# Patient Record
Sex: Female | Born: 1960 | Race: Black or African American | Hispanic: No | Marital: Single | Smoking: Former smoker
Health system: Southern US, Community
[De-identification: ages and names within clinical notes are randomized; demographics above are authoritative.]

## PROBLEM LIST (undated history)

## (undated) DIAGNOSIS — J45909 Unspecified asthma, uncomplicated: Secondary | ICD-10-CM

## (undated) HISTORY — PX: HAND SURGERY: SHX662

## (undated) HISTORY — PX: EXTERNAL EAR SURGERY: SHX627

## (undated) HISTORY — PX: FOOT SURGERY: SHX648

---

## 1999-07-11 ENCOUNTER — Other Ambulatory Visit: Admission: RE | Admit: 1999-07-11 | Discharge: 1999-07-11 | Payer: Self-pay | Admitting: Obstetrics and Gynecology

## 2000-09-03 ENCOUNTER — Other Ambulatory Visit: Admission: RE | Admit: 2000-09-03 | Discharge: 2000-09-03 | Payer: Self-pay | Admitting: Obstetrics and Gynecology

## 2001-11-25 ENCOUNTER — Other Ambulatory Visit: Admission: RE | Admit: 2001-11-25 | Discharge: 2001-11-25 | Payer: Self-pay | Admitting: Obstetrics and Gynecology

## 2003-03-05 ENCOUNTER — Other Ambulatory Visit: Admission: RE | Admit: 2003-03-05 | Discharge: 2003-03-05 | Payer: Self-pay | Admitting: Obstetrics and Gynecology

## 2004-09-20 ENCOUNTER — Other Ambulatory Visit: Admission: RE | Admit: 2004-09-20 | Discharge: 2004-09-20 | Payer: Self-pay | Admitting: Obstetrics and Gynecology

## 2005-12-11 ENCOUNTER — Other Ambulatory Visit: Admission: RE | Admit: 2005-12-11 | Discharge: 2005-12-11 | Payer: Self-pay | Admitting: Obstetrics and Gynecology

## 2006-02-05 ENCOUNTER — Other Ambulatory Visit: Admission: RE | Admit: 2006-02-05 | Discharge: 2006-02-05 | Payer: Self-pay | Admitting: Obstetrics and Gynecology

## 2007-03-13 ENCOUNTER — Emergency Department (HOSPITAL_COMMUNITY): Admission: EM | Admit: 2007-03-13 | Discharge: 2007-03-13 | Payer: Self-pay | Admitting: *Deleted

## 2007-03-17 ENCOUNTER — Emergency Department (HOSPITAL_COMMUNITY): Admission: EM | Admit: 2007-03-17 | Discharge: 2007-03-17 | Payer: Self-pay | Admitting: Emergency Medicine

## 2008-02-21 ENCOUNTER — Encounter: Admission: RE | Admit: 2008-02-21 | Discharge: 2008-02-21 | Payer: Self-pay | Admitting: Obstetrics and Gynecology

## 2009-05-10 ENCOUNTER — Encounter: Admission: RE | Admit: 2009-05-10 | Discharge: 2009-05-10 | Payer: Self-pay | Admitting: Obstetrics and Gynecology

## 2010-05-12 ENCOUNTER — Encounter: Admission: RE | Admit: 2010-05-12 | Discharge: 2010-05-12 | Payer: Self-pay | Admitting: Obstetrics and Gynecology

## 2011-05-02 ENCOUNTER — Other Ambulatory Visit: Payer: Self-pay | Admitting: Obstetrics and Gynecology

## 2011-05-02 DIAGNOSIS — Z1231 Encounter for screening mammogram for malignant neoplasm of breast: Secondary | ICD-10-CM

## 2011-05-15 ENCOUNTER — Ambulatory Visit
Admission: RE | Admit: 2011-05-15 | Discharge: 2011-05-15 | Disposition: A | Discharge status: Home / Self Care | Payer: BC Managed Care – PPO | Source: Ambulatory Visit | Attending: Obstetrics and Gynecology | Admitting: Obstetrics and Gynecology

## 2011-05-15 DIAGNOSIS — Z1231 Encounter for screening mammogram for malignant neoplasm of breast: Secondary | ICD-10-CM

## 2012-03-28 ENCOUNTER — Other Ambulatory Visit: Payer: Self-pay | Admitting: Obstetrics and Gynecology

## 2012-03-28 DIAGNOSIS — Z1231 Encounter for screening mammogram for malignant neoplasm of breast: Secondary | ICD-10-CM

## 2012-05-15 ENCOUNTER — Ambulatory Visit: Payer: BC Managed Care – PPO

## 2012-06-05 ENCOUNTER — Ambulatory Visit: Payer: BC Managed Care – PPO

## 2012-06-07 ENCOUNTER — Ambulatory Visit
Admission: RE | Admit: 2012-06-07 | Discharge: 2012-06-07 | Disposition: A | Discharge status: Home / Self Care | Payer: BC Managed Care – PPO | Source: Ambulatory Visit | Attending: Obstetrics and Gynecology | Admitting: Obstetrics and Gynecology

## 2012-06-07 DIAGNOSIS — Z1231 Encounter for screening mammogram for malignant neoplasm of breast: Secondary | ICD-10-CM

## 2013-07-23 ENCOUNTER — Other Ambulatory Visit: Payer: Self-pay

## 2013-07-23 DIAGNOSIS — Z1231 Encounter for screening mammogram for malignant neoplasm of breast: Secondary | ICD-10-CM

## 2013-08-08 ENCOUNTER — Ambulatory Visit
Admission: RE | Admit: 2013-08-08 | Discharge: 2013-08-08 | Disposition: A | Discharge status: Home / Self Care | Payer: BC Managed Care – PPO | Source: Ambulatory Visit

## 2013-08-08 DIAGNOSIS — Z1231 Encounter for screening mammogram for malignant neoplasm of breast: Secondary | ICD-10-CM

## 2014-08-20 ENCOUNTER — Other Ambulatory Visit: Payer: Self-pay

## 2014-08-20 DIAGNOSIS — Z1239 Encounter for other screening for malignant neoplasm of breast: Secondary | ICD-10-CM

## 2014-09-14 ENCOUNTER — Ambulatory Visit: Payer: BC Managed Care – PPO

## 2014-09-16 ENCOUNTER — Ambulatory Visit
Admission: RE | Admit: 2014-09-16 | Discharge: 2014-09-16 | Disposition: A | Discharge status: Home / Self Care | Payer: BC Managed Care – PPO | Source: Ambulatory Visit

## 2014-09-16 ENCOUNTER — Other Ambulatory Visit: Payer: Self-pay

## 2014-09-16 DIAGNOSIS — Z1231 Encounter for screening mammogram for malignant neoplasm of breast: Secondary | ICD-10-CM

## 2015-09-28 ENCOUNTER — Other Ambulatory Visit: Payer: Self-pay

## 2015-09-28 DIAGNOSIS — Z1231 Encounter for screening mammogram for malignant neoplasm of breast: Secondary | ICD-10-CM

## 2015-10-15 ENCOUNTER — Ambulatory Visit: Payer: BC Managed Care – PPO

## 2015-10-25 ENCOUNTER — Ambulatory Visit: Payer: BC Managed Care – PPO

## 2015-11-19 ENCOUNTER — Ambulatory Visit: Payer: BC Managed Care – PPO

## 2016-01-13 ENCOUNTER — Ambulatory Visit: Payer: BC Managed Care – PPO

## 2016-01-14 ENCOUNTER — Ambulatory Visit
Admission: RE | Admit: 2016-01-14 | Discharge: 2016-01-14 | Disposition: A | Discharge status: Home / Self Care | Payer: BC Managed Care – PPO | Source: Ambulatory Visit

## 2016-01-14 DIAGNOSIS — Z1231 Encounter for screening mammogram for malignant neoplasm of breast: Secondary | ICD-10-CM

## 2016-06-27 ENCOUNTER — Other Ambulatory Visit: Payer: Self-pay | Admitting: Obstetrics and Gynecology

## 2016-06-27 ENCOUNTER — Other Ambulatory Visit (HOSPITAL_COMMUNITY)
Admission: RE | Admit: 2016-06-27 | Discharge: 2016-06-27 | Disposition: A | Discharge status: Home / Self Care | Payer: BC Managed Care – PPO | Source: Ambulatory Visit | Attending: Obstetrics and Gynecology | Admitting: Obstetrics and Gynecology

## 2016-06-27 DIAGNOSIS — Z1151 Encounter for screening for human papillomavirus (HPV): Secondary | ICD-10-CM | POA: Diagnosis not present

## 2016-06-27 DIAGNOSIS — Z113 Encounter for screening for infections with a predominantly sexual mode of transmission: Secondary | ICD-10-CM | POA: Diagnosis present

## 2016-06-27 DIAGNOSIS — Z01419 Encounter for gynecological examination (general) (routine) without abnormal findings: Secondary | ICD-10-CM | POA: Diagnosis present

## 2016-06-30 LAB — CYTOLOGY - PAP

## 2016-08-27 ENCOUNTER — Encounter (HOSPITAL_COMMUNITY): Payer: Self-pay | Admitting: Emergency Medicine

## 2016-08-27 ENCOUNTER — Emergency Department (HOSPITAL_COMMUNITY)
Admission: EM | Admit: 2016-08-27 | Discharge: 2016-08-27 | Disposition: A | Discharge status: Home / Self Care | Payer: Worker's Compensation | Attending: Emergency Medicine | Admitting: Emergency Medicine

## 2016-08-27 DIAGNOSIS — Y829 Unspecified medical devices associated with adverse incidents: Secondary | ICD-10-CM | POA: Diagnosis not present

## 2016-08-27 DIAGNOSIS — K5900 Constipation, unspecified: Secondary | ICD-10-CM | POA: Diagnosis present

## 2016-08-27 DIAGNOSIS — T887XXA Unspecified adverse effect of drug or medicament, initial encounter: Secondary | ICD-10-CM | POA: Insufficient documentation

## 2016-08-27 DIAGNOSIS — K5903 Drug induced constipation: Secondary | ICD-10-CM | POA: Diagnosis not present

## 2016-08-27 DIAGNOSIS — T402X5A Adverse effect of other opioids, initial encounter: Secondary | ICD-10-CM | POA: Insufficient documentation

## 2016-08-27 LAB — CBC WITH DIFFERENTIAL/PLATELET
BASOS ABS: 0 10*3/uL (ref 0.0–0.1)
BASOS PCT: 0 %
EOS ABS: 0.2 10*3/uL (ref 0.0–0.7)
EOS PCT: 2 %
HEMATOCRIT: 35.9 % — AB (ref 36.0–46.0)
Hemoglobin: 11.8 g/dL — ABNORMAL LOW (ref 12.0–15.0)
Lymphocytes Relative: 40 %
Lymphs Abs: 3.1 10*3/uL (ref 0.7–4.0)
MCH: 29.1 pg (ref 26.0–34.0)
MCHC: 32.9 g/dL (ref 30.0–36.0)
MCV: 88.4 fL (ref 78.0–100.0)
MONO ABS: 0.5 10*3/uL (ref 0.1–1.0)
MONOS PCT: 6 %
NEUTROS ABS: 3.9 10*3/uL (ref 1.7–7.7)
Neutrophils Relative %: 51 %
PLATELETS: 352 10*3/uL (ref 150–400)
RBC: 4.06 MIL/uL (ref 3.87–5.11)
RDW: 13.5 % (ref 11.5–15.5)
WBC: 7.6 10*3/uL (ref 4.0–10.5)

## 2016-08-27 LAB — COMPREHENSIVE METABOLIC PANEL
ALBUMIN: 3.8 g/dL (ref 3.5–5.0)
ALT: 21 U/L (ref 14–54)
ANION GAP: 8 (ref 5–15)
AST: 22 U/L (ref 15–41)
Alkaline Phosphatase: 63 U/L (ref 38–126)
BILIRUBIN TOTAL: 0.6 mg/dL (ref 0.3–1.2)
BUN: 13 mg/dL (ref 6–20)
CHLORIDE: 105 mmol/L (ref 101–111)
CO2: 26 mmol/L (ref 22–32)
Calcium: 9.6 mg/dL (ref 8.9–10.3)
Creatinine, Ser: 1.02 mg/dL — ABNORMAL HIGH (ref 0.44–1.00)
GFR calc Af Amer: >60 mL/min (ref >60)
GFR calc non Af Amer: >60 mL/min (ref >60)
GLUCOSE: 103 mg/dL — AB (ref 65–99)
POTASSIUM: 3.6 mmol/L (ref 3.5–5.1)
SODIUM: 139 mmol/L (ref 135–145)
TOTAL PROTEIN: 7.1 g/dL (ref 6.5–8.1)

## 2016-08-27 LAB — URINALYSIS, ROUTINE W REFLEX MICROSCOPIC
Bilirubin Urine: NEGATIVE
GLUCOSE, UA: NEGATIVE mg/dL
HGB URINE DIPSTICK: NEGATIVE
Ketones, ur: NEGATIVE mg/dL
LEUKOCYTES UA: NEGATIVE
Nitrite: NEGATIVE
PROTEIN: NEGATIVE mg/dL
SPECIFIC GRAVITY, URINE: 1.011 (ref 1.005–1.030)
pH: 7 (ref 5.0–8.0)

## 2016-08-27 LAB — POC URINE PREG, ED: Preg Test, Ur: NEGATIVE

## 2016-08-27 MED ORDER — POLYETHYLENE GLYCOL 3350 17 GM/SCOOP PO POWD: 17.0000 g | Freq: Two times a day (BID) | ORAL | 0 refills | Status: DC

## 2016-08-27 MED ORDER — MILK AND MOLASSES ENEMA
1.0000 | Freq: Once | RECTAL | Status: DC
  Filled 2016-08-27: qty 250

## 2016-08-27 NOTE — ED Provider Notes (Signed)
6:14 AM Patient signed out to me at change of shift by [facility] Mut[facility], PA-C.  Patient with opioid-induced constipation following orthopedic surgery.  Pending enema.  Anticipate d/c home following this.    7:23 AM Nurse reports patient has had bowel movement.  Plan for d/c home with prescriptions and instructions.     Trixie Dredgemily Dezi Schaner, PA-C 08/27/16 [id number]    Layla MawKristen N Ward, DO 08/27/16 [phone]

## 2016-08-27 NOTE — ED Provider Notes (Addendum)
[facility]-EMERGENCY DEPT Provider Note   CSN: [id number] Arrival date & time: 08/27/16  0011     History   Chief Complaint Chief Complaint  Patient presents with  . Constipation    HPI Dana Diaz is a 55 y.o. female with no major medical problems presents to the Emergency Department complaining of gradual, persistent, progressively worsening constipation onset Monday (5 days ago).  Pt reports last normal BM was 5 days ago.  She then had surgery on Tuesday and has been taking oxycodone around the clock for several days.  She reports 3 tabs of colace in the last 24 hours without relief.  Pt reports she has a large ball of stool in her rectum that will not come out.  She has tried a fleet enema today without relief.  Pt denies abd pain, N/V/D, weakness, fever, chills.      The history is provided by the patient and medical records. No language interpreter was used.    History reviewed. No pertinent past medical history.  There are no active problems to display for this patient.   Past Surgical History:  Procedure Laterality Date  . FOOT SURGERY      OB History    No data available       Home Medications    Prior to Admission medications   Medication Sig Start Date End Date Taking? Authorizing Provider  polyethylene glycol powder (GLYCOLAX/MIRALAX) powder Take 17 g by mouth 2 (two) times daily. 08/27/16   Whitnie Deleon, PA-C    Family History No family history on file.  Social History Social History  Substance Use Topics  . Smoking status: Never Smoker  . Smokeless tobacco: Never Used  . Alcohol use No     Allergies   Review of patient's allergies indicates no known allergies.   Review of Systems Review of Systems  Gastrointestinal: Positive for constipation.  All other systems reviewed and are negative.    Physical Exam Updated Vital Signs BP 124/84 (BP Location: Left Arm)   Pulse 99   Temp 98.1 F (36.7 C) (Oral)   Resp 18   LMP  07/27/2016   SpO2 100%   Physical Exam  Constitutional: She appears well-developed and well-nourished. No distress.  Awake, alert, nontoxic appearance  HENT:  Head: Normocephalic and atraumatic.  Mouth/Throat: Oropharynx is clear and moist. No oropharyngeal exudate.  Eyes: Conjunctivae are normal. No scleral icterus.  Neck: Normal range of motion. Neck supple.  Cardiovascular: Normal rate, regular rhythm and intact distal pulses.   Pulmonary/Chest: Effort normal and breath sounds normal. No respiratory distress. She has no wheezes.  Equal chest expansion  Abdominal: Soft. Bowel sounds are normal. She exhibits no mass. There is no tenderness. There is no rebound and no guarding.  Genitourinary:  Genitourinary Comments: Hard stool in the rectal vault. Unable to manually disimpact.  Musculoskeletal: Normal range of motion. She exhibits no edema.  Right foot with cast and post-op shoe in place  Neurological: She is alert.  Speech is clear and goal oriented Moves extremities without ataxia  Skin: Skin is warm and dry. She is not diaphoretic.  Psychiatric: She has a normal mood and affect.  Nursing note and vitals reviewed.    ED Treatments / Results  Labs (all labs ordered are listed, but only abnormal results are displayed) Labs Reviewed  CBC WITH DIFFERENTIAL/PLATELET - Abnormal; Notable for the following:       Result Value   Hemoglobin 11.8 (*)    HCT  35.9 (*)    All other components within normal limits  COMPREHENSIVE METABOLIC PANEL - Abnormal; Notable for the following:    Glucose, Bld 103 (*)    Creatinine, Ser 1.02 (*)    All other components within normal limits  URINALYSIS, ROUTINE W REFLEX MICROSCOPIC (NOT AT [facility])  POC URINE PREG, ED    Procedures Procedures (including critical care time)  Medications Ordered in ED Medications  milk and molasses enema (not administered)     Initial Impression / Assessment and Plan / ED Course  I have reviewed the triage  vital signs and the nursing notes.  Pertinent labs & imaging results that were available during my care of the patient were reviewed by me and considered in my medical decision making (see chart for details).  Clinical Course  Comment By Time  Hard stool in the rectal vault but unable to manually disimpact. Will give enema to attempt bowel movement. Dahlia ClientHannah Davaun Quintela, PA-C 10/15 [phone]  Enema pending.  At shift change care transferred to [facility]-C who will follow, reevaluate and dispo accordingly. Dierdre ForthHannah Montserrat Shek, PA-C 10/15 [phone]    Patient with opioid-induced constipation. Unable to manually disimpact. Will give out and molasses enema. Patient will be discharged home with Miralax.  Discussed other conservative therapies including increasing fluids, fiber and stool softeners.  Final Clinical Impressions(s) / ED Diagnoses   Final diagnoses:  Constipation due to opioid therapy    New Prescriptions New Prescriptions   POLYETHYLENE GLYCOL POWDER (GLYCOLAX/MIRALAX) POWDER    Take 17 g by mouth 2 (two) times daily.     Dahlia ClientHannah Oluwatobi Ruppe, PA-C 08/27/16 [phone]    Layla MawKristen N Ward, DO 08/27/16 0615    Dahlia ClientHannah Khylee Algeo, PA-C 08/27/16 [phone]    Layla MawKristen N Ward, DO 08/27/16 [phone]

## 2016-08-27 NOTE — ED Triage Notes (Signed)
Pt. reports constipation for several days unrelieved by laxative and enema , pt. had a recent right foot surgery this Tuesday and was prescribed with Oxycodone for pain control .

## 2016-08-27 NOTE — ED Notes (Signed)
Matt, RN, reports giving enema with good results around 0530.

## 2016-08-27 NOTE — Discharge Instructions (Addendum)
Read the information below.  Use the prescribed medication as directed.  Please discuss all new medications with your pharmacist.  You may return to the Emergency Department at any time for worsening condition or any new symptoms that concern you.    °

## 2017-01-01 ENCOUNTER — Other Ambulatory Visit: Payer: Self-pay | Admitting: Obstetrics and Gynecology

## 2017-01-01 DIAGNOSIS — Z1231 Encounter for screening mammogram for malignant neoplasm of breast: Secondary | ICD-10-CM

## 2017-01-19 ENCOUNTER — Ambulatory Visit: Payer: Self-pay

## 2017-01-26 ENCOUNTER — Ambulatory Visit
Admission: RE | Admit: 2017-01-26 | Discharge: 2017-01-26 | Disposition: A | Discharge status: Home / Self Care | Payer: BC Managed Care – PPO | Source: Ambulatory Visit | Attending: Obstetrics and Gynecology | Admitting: Obstetrics and Gynecology

## 2017-01-26 DIAGNOSIS — Z1231 Encounter for screening mammogram for malignant neoplasm of breast: Secondary | ICD-10-CM

## 2017-04-18 ENCOUNTER — Encounter (HOSPITAL_COMMUNITY): Payer: Self-pay | Admitting: Emergency Medicine

## 2017-04-18 ENCOUNTER — Emergency Department (HOSPITAL_COMMUNITY)
Admission: EM | Admit: 2017-04-18 | Discharge: 2017-04-18 | Disposition: A | Discharge status: Home / Self Care | Payer: BC Managed Care – PPO | Attending: Emergency Medicine | Admitting: Emergency Medicine

## 2017-04-18 ENCOUNTER — Emergency Department (HOSPITAL_COMMUNITY): Payer: BC Managed Care – PPO

## 2017-04-18 ENCOUNTER — Other Ambulatory Visit: Payer: Self-pay

## 2017-04-18 DIAGNOSIS — R0789 Other chest pain: Secondary | ICD-10-CM | POA: Diagnosis not present

## 2017-04-18 DIAGNOSIS — Z79899 Other long term (current) drug therapy: Secondary | ICD-10-CM | POA: Diagnosis not present

## 2017-04-18 DIAGNOSIS — J45909 Unspecified asthma, uncomplicated: Secondary | ICD-10-CM | POA: Diagnosis not present

## 2017-04-18 DIAGNOSIS — R1310 Dysphagia, unspecified: Secondary | ICD-10-CM | POA: Diagnosis not present

## 2017-04-18 HISTORY — DX: Unspecified asthma, uncomplicated: J45.909

## 2017-04-18 LAB — BASIC METABOLIC PANEL
Anion gap: 9 (ref 5–15)
BUN: 11 mg/dL (ref 6–20)
CALCIUM: 9.4 mg/dL (ref 8.9–10.3)
CO2: 24 mmol/L (ref 22–32)
Chloride: 106 mmol/L (ref 101–111)
Creatinine, Ser: 0.95 mg/dL (ref 0.44–1.00)
GFR calc Af Amer: >60 mL/min (ref >60)
GLUCOSE: 104 mg/dL — AB (ref 65–99)
Potassium: 3.4 mmol/L — ABNORMAL LOW (ref 3.5–5.1)
Sodium: 139 mmol/L (ref 135–145)

## 2017-04-18 LAB — CBC
HCT: 35.5 % — ABNORMAL LOW (ref 36.0–46.0)
Hemoglobin: 11.8 g/dL — ABNORMAL LOW (ref 12.0–15.0)
MCH: 29.1 pg (ref 26.0–34.0)
MCHC: 33.2 g/dL (ref 30.0–36.0)
MCV: 87.4 fL (ref 78.0–100.0)
Platelets: 359 10*3/uL (ref 150–400)
RBC: 4.06 MIL/uL (ref 3.87–5.11)
RDW: 13.9 % (ref 11.5–15.5)
WBC: 6.6 10*3/uL (ref 4.0–10.5)

## 2017-04-18 LAB — POCT I-STAT TROPONIN I
TROPONIN I, POC: 0 ng/mL (ref 0.00–0.08)
TROPONIN I, POC: 0 ng/mL (ref 0.00–0.08)

## 2017-04-18 MED ORDER — GI COCKTAIL ~~LOC~~
30.0000 mL | Freq: Once | ORAL | Status: AC
  Administered 2017-04-18: 30 mL via ORAL
  Filled 2017-04-18: qty 30

## 2017-04-18 MED ORDER — METHOCARBAMOL 500 MG PO TABS: 500.0000 mg | ORAL_TABLET | Freq: Two times a day (BID) | ORAL | 0 refills | Status: DC

## 2017-04-18 MED ORDER — PANTOPRAZOLE SODIUM 20 MG PO TBEC: 20.0000 mg | DELAYED_RELEASE_TABLET | Freq: Two times a day (BID) | ORAL | 0 refills | Status: DC

## 2017-04-18 MED ORDER — SUCRALFATE 1 GM/10ML PO SUSP: 1.0000 g | Freq: Three times a day (TID) | ORAL | 0 refills | Status: DC

## 2017-04-18 MED ORDER — FERROUS SULFATE 325 (65 FE) MG PO TABS: 325.0000 mg | ORAL_TABLET | Freq: Every day | ORAL | 0 refills | Status: DC

## 2017-04-18 NOTE — Discharge Instructions (Addendum)
Please follow up with gastroenterology. Call the number provided to set up an appointment.  Use the Carafate, as prescribed, for the next few days. Use the protonix twice a day. Robaxin as needed. Tylenol for pain.   Iron supplement daily due to minor anemia. Follow up with your primary care provider on this matter.

## 2017-04-18 NOTE — ED Notes (Signed)
Pt had a cup of water done well 

## 2017-04-18 NOTE — ED Provider Notes (Signed)
[facility]-EMERGENCY DEPT Provider Note   CSN: [id number] Arrival date & time: 04/18/17  1332     History   Chief Complaint Chief Complaint  Patient presents with  . Chest Pain    HPI Dana Diaz is a 56 y.o. female.  HPI   Dana Diaz is a 56 y.o. female, with a history of Asthma, presenting to the ED with chest discomfort for the last two days. Patient states that she was eating something and having an intensely unpleasant text conversation with a friend. She suddenly began to feel pain run from her chest to her throat. Initially was 10/10, now rates it 4/10 and describes it as a soreness. Worse with swallowing or burping. She ate soup yesterday, was able to swallow it, and keep it down, but states it caused her pain. States that something in her chest and throat now feel swollen.  Denies fever/chills, N/V, cough, shortness of breath, drooling, or any other complaints.      Past Medical History:  Diagnosis Date  . Asthma     There are no active problems to display for this patient.   Past Surgical History:  Procedure Laterality Date  . FOOT SURGERY      OB History    No data available       Home Medications    Prior to Admission medications   Medication Sig Start Date End Date Taking? Authorizing Provider  albuterol (PROVENTIL) (2.5 MG/3ML) 0.083% nebulizer solution Take 2.5 mg by nebulization every 6 (six) hours as needed for wheezing or shortness of breath.  03/31/17  Yes [provider]  gabapentin (NEURONTIN) 100 MG capsule take 1 capsule by mouth twice daily 04/13/17  Yes [provider]  ferrous sulfate 325 (65 FE) MG tablet Take 1 tablet (325 mg total) by mouth daily. 04/18/17   Joy, Shawn C, PA-C  fluticasone (FLONASE) 50 MCG/ACT nasal spray Place 1 spray into both nostrils daily as needed for allergies.  03/31/17   [provider]  JUNEL 1/20 1-20 MG-MCG tablet TAKE 1 TABLET BY MOUTH EVERY DAY FOR 3 WEEKS *1 WEEK OFF* 03/13/17    [provider]  methocarbamol (ROBAXIN) 500 MG tablet Take 1 tablet (500 mg total) by mouth 2 (two) times daily. 04/18/17   Joy, Shawn C, PA-C  pantoprazole (PROTONIX) 20 MG tablet Take 1 tablet (20 mg total) by mouth 2 (two) times daily before a meal. 04/18/17 04/23/17  Joy, Shawn C, PA-C  polyethylene glycol powder (GLYCOLAX/MIRALAX) powder Take 17 g by mouth 2 (two) times daily. Patient not taking: Reported on 04/18/2017 08/27/16   Muthersbaugh, Dahlia Client, PA-C  sucralfate (CARAFATE) 1 GM/10ML suspension Take 10 mLs (1 g total) by mouth 4 (four) times daily -  with meals and at bedtime. 04/18/17   Anselm Pancoast, PA-C    Family History History reviewed. No pertinent family history.  Social History Social History  Substance Use Topics  . Smoking status: Never Smoker  . Smokeless tobacco: Never Used  . Alcohol use No     Allergies   Patient has no known allergies.   Review of Systems Review of Systems  Constitutional: Negative for chills, diaphoresis and fever.  HENT: Negative for drooling and voice change.        Painful swallowing  Respiratory: Negative for cough and shortness of breath.   Gastrointestinal: Negative for abdominal pain, diarrhea, nausea and vomiting.       Pain with swallowing.  Musculoskeletal: Negative for back  pain.  Neurological: Negative for dizziness and light-headedness.  All other systems reviewed and are negative.    Physical Exam Updated Vital Signs BP 136/76 (BP Location: Left Arm)   Pulse 82   Temp 98 F (36.7 C) (Oral)   Resp 16   LMP 02/28/2017 (Approximate)   SpO2 100%   Physical Exam  Constitutional: She appears well-developed and well-nourished. No distress.  HENT:  Head: Normocephalic and atraumatic.  Mouth/Throat: Oropharynx is clear and moist.  Handles oral secretions without difficulty.  Eyes: Conjunctivae are normal.  Neck: Normal range of motion and phonation normal. Neck supple. No tracheal deviation and no edema present.  No thyromegaly present.  Swallow reflex appears to be intact. No noted swelling to the soft tissues of the neck. Both sides of the patient's throat rise equally with swallowing. When pressure is applied to the trachea, patient endorses pain behind it.   Cardiovascular: Normal rate, regular rhythm, normal heart sounds and intact distal pulses.   Pulmonary/Chest: Effort normal and breath sounds normal. No stridor. No respiratory distress. She exhibits no tenderness.  Abdominal: Soft. There is no tenderness. There is no guarding.  Musculoskeletal: She exhibits no edema.  Lymphadenopathy:    She has no cervical adenopathy.  Neurological: She is alert.  Skin: Skin is warm and dry. She is not diaphoretic. No pallor.  Psychiatric: She has a normal mood and affect. Her behavior is normal.  Nursing note and vitals reviewed.    ED Treatments / Results  Labs (all labs ordered are listed, but only abnormal results are displayed) Labs Reviewed  BASIC METABOLIC PANEL - Abnormal; Notable for the following:       Result Value   Potassium 3.4 (*)    Glucose, Bld 104 (*)    All other components within normal limits  CBC - Abnormal; Notable for the following:    Hemoglobin 11.8 (*)    HCT 35.5 (*)    All other components within normal limits  I-STAT TROPOININ, ED  POCT I-STAT TROPONIN I  I-STAT TROPOININ, ED  POCT I-STAT TROPONIN I    EKG  EKG Interpretation None       Radiology Dg Chest 2 View  Result Date: 04/18/2017 CLINICAL DATA:  Chest pain for 2 days EXAM: CHEST  2 VIEW COMPARISON:  Mar 17, 2007 FINDINGS: There is slight scarring in the medial left base. Lungs elsewhere are clear. Heart size and pulmonary vascularity are normal. No adenopathy. No pneumothorax. No bone lesions. IMPRESSION: Slight scarring medial left base. Lungs elsewhere clear. Cardiac silhouette within normal limits. Electronically Signed   By: Bretta Bang III M.D.   On: 04/18/2017 14:45     Procedures Procedures (including critical care time)  ED ECG REPORT   Date: 04/18/2017  Rate: 90  Rhythm: Sinus rhythm  QRS Axis: normal  Intervals: normal  ST/T Wave abnormalities: normal  Conduction Disutrbances:none  Narrative Interpretation:   Old EKG Reviewed: none available  I have personally reviewed the EKG tracing and agree with the computerized printout as noted.  Medications Ordered in ED Medications  gi cocktail (Maalox,Lidocaine,Donnatal) (30 mLs Oral Given 04/18/17 2035)     Initial Impression / Assessment and Plan / ED Course  I have reviewed the triage vital signs and the nursing notes.  Pertinent labs & imaging results that were available during my care of the patient were reviewed by me and considered in my medical decision making (see chart for details).     Patient presents with  painful swallowing. Handles oral secretions without difficulty. Patient is nontoxic appearing, afebrile, not tachycardic, not tachypneic, not hypotensive, and maintains SPO2 of 98-100% on room air. Patient voiced improvement after GI cocktail. Able to readily pass an oral fluid challenge. GI follow-up. The patient and her husband at the bedside were given instructions for home care as well as return precautions. Both parties voice understanding of these instructions, accept the plan, and are comfortable with discharge.    Vitals:   04/18/17 1355 04/18/17 1915 04/18/17 2159  BP: 125/82 136/76 129/87  Pulse: 89 82 83  Resp: 16 16 20   Temp: 98.2 F (36.8 C) 98 F (36.7 C)   TempSrc: Oral Oral   SpO2: 98% 100% 100%     Final Clinical Impressions(s) / ED Diagnoses   Final diagnoses:  Pain with swallowing    New Prescriptions Discharge Medication List as of 04/18/2017  9:51 PM    START taking these medications   Details  ferrous sulfate 325 (65 FE) MG tablet Take 1 tablet (325 mg total) by mouth daily., Starting Wed 04/18/2017, Print    methocarbamol (ROBAXIN) 500 MG  tablet Take 1 tablet (500 mg total) by mouth 2 (two) times daily., Starting Wed 04/18/2017, Print    pantoprazole (PROTONIX) 20 MG tablet Take 1 tablet (20 mg total) by mouth 2 (two) times daily before a meal., Starting Wed 04/18/2017, Until Mon 04/23/2017, Print    sucralfate (CARAFATE) 1 GM/10ML suspension Take 10 mLs (1 g total) by mouth 4 (four) times daily -  with meals and at bedtime., Starting Wed 04/18/2017, Print         Joy, Shawn C, PA-C 04/18/17 2254    Jacalyn LefevreHaviland, Julie, MD 04/18/17 [phone]

## 2017-04-18 NOTE — ED Triage Notes (Signed)
Pt c/o intermittent central chest pain x 2 days. Pt states it feels like something is stuck in her throat x 2 days, feels that anything po makes pain and pressure worse. Pt in no distress.

## 2017-12-19 ENCOUNTER — Other Ambulatory Visit: Payer: Self-pay | Admitting: Obstetrics and Gynecology

## 2017-12-19 DIAGNOSIS — Z139 Encounter for screening, unspecified: Secondary | ICD-10-CM

## 2018-01-28 ENCOUNTER — Ambulatory Visit
Admission: RE | Admit: 2018-01-28 | Discharge: 2018-01-28 | Disposition: A | Discharge status: Home / Self Care | Payer: BC Managed Care – PPO | Source: Ambulatory Visit | Attending: Obstetrics and Gynecology | Admitting: Obstetrics and Gynecology

## 2018-01-28 DIAGNOSIS — Z139 Encounter for screening, unspecified: Secondary | ICD-10-CM

## 2018-09-25 ENCOUNTER — Other Ambulatory Visit: Payer: Self-pay | Admitting: *Deleted

## 2018-09-25 DIAGNOSIS — N6320 Unspecified lump in the left breast, unspecified quadrant: Secondary | ICD-10-CM

## 2018-09-27 ENCOUNTER — Ambulatory Visit
Admission: RE | Admit: 2018-09-27 | Discharge: 2018-09-27 | Disposition: A | Discharge status: Home / Self Care | Payer: BC Managed Care – PPO | Source: Ambulatory Visit | Attending: *Deleted | Admitting: *Deleted

## 2018-09-27 DIAGNOSIS — N6320 Unspecified lump in the left breast, unspecified quadrant: Secondary | ICD-10-CM

## 2018-11-07 NOTE — Progress Notes (Deleted)
Cardiology Office Note:    Date:  11/07/2018   ID:  Dana Diaz, DOB 11/20/1960, MRN [id number]  PCP:  Marva PandaMillsaps, Kimberly, NP  Cardiologist:  Norman HerrlichBrian Munley, MD   Referring MD: Marva PandaMillsaps, Kimberly, NP  ASSESSMENT:    No diagnosis found. PLAN:    In order of problems listed above:  1. ***  Next appointment   Medication Adjustments/Labs and Tests Ordered: Current medicines are reviewed at length with the patient today.  Concerns regarding medicines are outlined above.  No orders of the defined types were placed in this encounter.  No orders of the defined types were placed in this encounter.    No chief complaint on file. ***  History of Present Illness:    Dana Diaz is a 57 y.o. female with a history of asthma who is being seen today for the evaluation of *** at the request of Marva PandaMillsaps, Kimberly, NP. [facility]18/[facility] with chest pain and pain on swallowing felt to be esophageal in etiology and was placed on a PPI.  Her EKG was normal and troponin was undetectable.  Her chest x-ray showed slight scarring left chest otherwise normal  Past Medical History:  Diagnosis Date  . Asthma     Past Surgical History:  Procedure Laterality Date  . FOOT SURGERY      Current Medications: No outpatient medications have been marked as taking for the 11/08/18 encounter (Appointment) with Baldo DaubMunley, Brian J, MD.     Allergies:   Patient has no known allergies.   Social History   Socioeconomic History  . Marital status: Single    Spouse name: Not on file  . Number of children: Not on file  . Years of education: Not on file  . Highest education level: Not on file  Occupational History  . Not on file  Social Needs  . Financial resource strain: Not on file  . Food insecurity:    Worry: Not on file    Inability: Not on file  . Transportation needs:    Medical: Not on file    Non-medical: Not on file  Tobacco Use  . Smoking status:  Never Smoker  . Smokeless tobacco: Never Used  Substance and Sexual Activity  . Alcohol use: No  . Drug use: No  . Sexual activity: Not on file  Lifestyle  . Physical activity:    Days per week: Not on file    Minutes per session: Not on file  . Stress: Not on file  Relationships  . Social connections:    Talks on phone: Not on file    Gets together: Not on file    Attends religious service: Not on file    Active member of club or organization: Not on file    Attends meetings of clubs or organizations: Not on file    Relationship status: Not on file  Other Topics Concern  . Not on file  Social History Narrative  . Not on file     Family History: The patient's ***family history includes Breast cancer in her maternal aunt.  ROS:   ROS Please see the history of present illness.    *** All other systems reviewed and are negative.  EKGs/Labs/Other Studies Reviewed:    The following studies were reviewed today: ***  EKG:  EKG is *** ordered today.  The ekg ordered today demonstrates ***  Recent Labs: No results found for requested labs within last 8760 hours.  Recent Lipid Panel No results found for: CHOL, TRIG, HDL, CHOLHDL, VLDL, LDLCALC, LDLDIRECT  Physical Exam:    VS:  There were no vitals taken for this visit.    Wt Readings from Last 3 Encounters:  No data found for Wt     GEN: *** Well nourished, well developed in no acute distress HEENT: Normal NECK: No JVD; No carotid bruits LYMPHATICS: No lymphadenopathy CARDIAC: ***RRR, no murmurs, rubs, gallops RESPIRATORY:  Clear to auscultation without rales, wheezing or rhonchi  ABDOMEN: Soft, non-tender, non-distended MUSCULOSKELETAL:  No edema; No deformity  SKIN: Warm and dry NEUROLOGIC:  Alert and oriented x 3 PSYCHIATRIC:  Normal affect     Signed, Norman HerrlichBrian Munley, MD  11/07/2018 9:52 AM    [facility]

## 2018-11-08 ENCOUNTER — Ambulatory Visit: Payer: Self-pay | Admitting: Cardiology

## 2018-11-14 NOTE — Progress Notes (Signed)
Cardiology Office Note:    Date:  11/15/2018   ID:  Dana Diaz, DOB May 08, 1961, MRN [id number]  PCP:  Marva Panda, NP  Cardiologist:  Norman Herrlich, MD   Referring MD: Marva Panda, NP  ASSESSMENT:    1. Palpitations   2. Irregular heartbeat    PLAN:    In order of problems listed above:  1. Symptoms are suggestive of arrhythmia will utilize a 2-week event monitor and if we document arrhythmia consider suppression if SVT she would benefit from a beta-blocker if having PAF she require an antiarrhythmic drug.  Next appointment 6 weeks   Medication Adjustments/Labs and Tests Ordered: Current medicines are reviewed at length with the patient today.  Concerns regarding medicines are outlined above.  Orders Placed This Encounter  Procedures  . LONG TERM MONITOR (3-14 DAYS)  . EKG 12-Lead   No orders of the defined types were placed in this encounter.    Chief Complaint  Patient presents with  . Palpitations    History of Present Illness:    Dana Diaz is a 58 y.o. female who is being seen today for the evaluation of palpitation at the request of Marva Panda, NP. For the last few months she has been having episodes where she gets brief rapid irregular heart rhythm and when it happens at nighttime she is momentarily short of breath.  She has episodes during the day where her heart quivers and also intermittently notices that she is dizzy or lightheaded but has not had syncope.  She has no document arrhythmia and shows me blood pressure heart rate recordings and on one occasion there was alarm for irregular heart rhythm and rates were in the range of 905 bpm with her symptoms.  She has no history of congenital rheumatic heart disease takes no over-the-counter proarrhythmic medications routine labs have been normal and thyroid was normal assessed in April 2008.  She is concerned about the potential of atrial fibrillation.  Past Medical History:  Diagnosis  Date  . Asthma     Past Surgical History:  Procedure Laterality Date  . EXTERNAL EAR SURGERY    . FOOT SURGERY    . HAND SURGERY      Current Medications: Current Meds  Medication Sig  . albuterol (PROVENTIL) (2.5 MG/3ML) 0.083% nebulizer solution Take 2.5 mg by nebulization every 6 (six) hours as needed for wheezing or shortness of breath.   Marland Kitchen azelastine (ASTELIN) 0.1 % nasal spray Place 1 spray into both nostrils daily. Use in each nostril as directed  . budesonide-formoterol (SYMBICORT) 160-4.5 MCG/ACT inhaler Inhale 2 puffs into the lungs 2 (two) times daily as needed.  Marland Kitchen dexlansoprazole (DEXILANT) 60 MG capsule Take 60 mg by mouth daily as needed.  . ferrous sulfate 325 (65 FE) MG tablet Take 1 tablet (325 mg total) by mouth daily.  . Olopatadine HCl 0.2 % SOLN Apply 1 drop to eye daily.  . sucralfate (CARAFATE) 1 GM/10ML suspension Take 10 mLs (1 g total) by mouth 4 (four) times daily -  with meals and at bedtime.     Allergies:   Patient has no known allergies.   Social History   Socioeconomic History  . Marital status: Single    Spouse name: Not on file  . Number of children: Not on file  . Years of education: Not on file  . Highest education level: Not on file  Occupational History  . Not on file  Social Needs  . Physicist, medical  strain: Not on file  . Food insecurity:    Worry: Not on file    Inability: Not on file  . Transportation needs:    Medical: Not on file    Non-medical: Not on file  Tobacco Use  . Smoking status: Former Smoker    Packs/day: 1.00    Years: 1.50    Pack years: 1.50  . Smokeless tobacco: Never Used  Substance and Sexual Activity  . Alcohol use: Yes    Comment: occasionally  . Drug use: No  . Sexual activity: Not on file  Lifestyle  . Physical activity:    Days per week: Not on file    Minutes per session: Not on file  . Stress: Not on file  Relationships  . Social connections:    Talks on phone: Not on file    Gets  together: Not on file    Attends religious service: Not on file    Active member of club or organization: Not on file    Attends meetings of clubs or organizations: Not on file    Relationship status: Not on file  Other Topics Concern  . Not on file  Social History Narrative  . Not on file     Family History: The patient's family history includes Breast cancer in her maternal aunt; CAD in her father; Diabetes in her brother and maternal grandmother; Irregular heart beat in her father and mother; Prostate cancer in her father; Stroke in her maternal aunt and maternal uncle.  ROS:   Review of Systems  Constitution: Positive for malaise/fatigue.  HENT: Negative.   Eyes: Negative.   Cardiovascular: Positive for palpitations.  Respiratory: Positive for shortness of breath (with palpitation).   Endocrine: Negative.   Hematologic/Lymphatic: Negative.   Skin: Negative.   Musculoskeletal: Negative.   Gastrointestinal: Negative.   Genitourinary: Negative.   Neurological: Positive for dizziness.  Psychiatric/Behavioral: Negative.   Allergic/Immunologic: Negative.    Please see the history of present illness.     All other systems reviewed and are negative.  EKGs/Labs/Other Studies Reviewed:    The following studies were reviewed today:   EKG:  EKG is  ordered today.  The ekg ordered today demonstrates sinus rhythm and is normal  Recent Labs: No results found for requested labs within last 8760 hours.  Recent Lipid Panel No results found for: CHOL, TRIG, HDL, CHOLHDL, VLDL, LDLCALC, LDLDIRECT  Physical Exam:    VS:  BP 118/78 (BP Location: Left Arm, Patient Position: Sitting, Cuff Size: Large)   Pulse 87   Ht 5' 7.5" (1.715 m)   Wt 215 lb 12.8 oz (97.9 kg)   BMI 33.30 kg/m     Wt Readings from Last 3 Encounters:  11/15/18 215 lb 12.8 oz (97.9 kg)     GEN:  Well nourished, well developed in no acute distress HEENT: Normal NECK: No JVD; No carotid bruits LYMPHATICS: No  lymphadenopathy CARDIAC: RRR, no murmurs, rubs, gallops RESPIRATORY:  Clear to auscultation without rales, wheezing or rhonchi  ABDOMEN: Soft, non-tender, non-distended MUSCULOSKELETAL:  No edema; No deformity  SKIN: Warm and dry NEUROLOGIC:  Alert and oriented x 3 PSYCHIATRIC:  Normal affect     Signed, Norman HerrlichBrian Delrico Minehart, MD  11/15/2018 2:37 PM    [facility]

## 2018-11-15 ENCOUNTER — Ambulatory Visit: Payer: BC Managed Care – PPO | Admitting: Cardiology

## 2018-11-15 ENCOUNTER — Encounter: Payer: Self-pay | Admitting: Cardiology

## 2018-11-15 ENCOUNTER — Ambulatory Visit: Payer: Self-pay | Admitting: Cardiology

## 2018-11-15 VITALS — BP 118/78 | HR 87 | Ht 67.5 in | Wt 215.8 lb

## 2018-11-15 DIAGNOSIS — R002 Palpitations: Secondary | ICD-10-CM

## 2018-11-15 DIAGNOSIS — I499 Cardiac arrhythmia, unspecified: Secondary | ICD-10-CM

## 2018-11-15 NOTE — Patient Instructions (Addendum)
Medication Instructions:  Your physician recommends that you continue on your current medications as directed. Please refer to the Current Medication list given to you today.  If you need a refill on your cardiac medications before your next appointment, please call your pharmacy.   Lab work: None  If you have labs (blood work) drawn today and your tests are completely normal, you will receive your results only by: Marland Kitchen MyChart Message (if you have MyChart) OR . A paper copy in the mail If you have any lab test that is abnormal or we need to change your treatment, we will call you to review the results.  Testing/Procedures: You had an EKG today.  Your physician has recommended that you wear a Zio monitor. Zio monitors are medical devices that record the heart's electrical activity. Doctors most often use these monitors to diagnose arrhythmias. Arrhythmias are problems with the speed or rhythm of the heartbeat. The monitor is a small, portable device. You can wear one while you do your normal daily activities. This is usually used to diagnose what is causing palpitations/syncope (passing out). Wear for 14 days.  Follow-Up: At [facility], you and your health needs are our priority.  As part of our continuing mission to provide you with exceptional heart care, we have created designated Provider Care Teams.  These Care Teams include your primary Cardiologist (physician) and Advanced Practice Providers (APPs -  Physician Assistants and Nurse Practitioners) who all work together to provide you with the care you need, when you need it. You will need a follow up appointment in 4 weeks.    KardiaMobile [email or web address]        FDA-cleared, clinical grade mobile EKG monitor: Lourena Simmonds is the most clinically-validated mobile EKG used by the world's leading cardiac care medical professionals With Basic service, know instantly if your heart rhythm is normal or if  atrial fibrillation is detected, and email the last single EKG recording to yourself or your doctor Premium service, available for purchase through the Kardia app for $9.99 per month or $99 per year, includes unlimited history and storage of your EKG recordings, a monthly EKG summary report to share with your doctor, along with the ability to track your blood pressure, activity and weight Includes one KardiaMobile phone clip FREE SHIPPING: Standard delivery 1-3 business days. Orders placed by 11:00am PST will ship that afternoon. Otherwise, will ship next business day. All orders ship via [employer] from [place], [place]  1. Avoid all over-the-counter antihistamines except Claritin/Loratadine and Zyrtec/Cetrizine. 2. Avoid all combination including cold sinus allergies flu decongestant and sleep medications 3. You can use Robitussin DM Mucinex and Mucinex DM for cough. 4. can use Tylenol aspirin ibuprofen and naproxen but no combinations such as sleep or sinus.

## 2018-11-26 ENCOUNTER — Ambulatory Visit (INDEPENDENT_AMBULATORY_CARE_PROVIDER_SITE_OTHER): Payer: BC Managed Care – PPO

## 2018-11-26 DIAGNOSIS — R002 Palpitations: Secondary | ICD-10-CM | POA: Diagnosis not present

## 2018-12-19 ENCOUNTER — Telehealth: Payer: Self-pay | Admitting: *Deleted

## 2018-12-19 DIAGNOSIS — I493 Ventricular premature depolarization: Secondary | ICD-10-CM

## 2018-12-19 DIAGNOSIS — I499 Cardiac arrhythmia, unspecified: Secondary | ICD-10-CM

## 2018-12-19 DIAGNOSIS — R002 Palpitations: Secondary | ICD-10-CM

## 2018-12-19 MED ORDER — ACEBUTOLOL HCL 200 MG PO CAPS: 200.0000 mg | ORAL_CAPSULE | Freq: Two times a day (BID) | ORAL | 2 refills | Status: DC

## 2018-12-19 NOTE — Telephone Encounter (Signed)
Patient informed of monitor results and is agreeable to having an echocardiogram done at the [facility] office. Patient is scheduled on Tuesday, 12/24/2018 at 4:00 pm. Address and phone number were provided. Patient will start acebutolol 200 mg twice daily today. Prescription has been sent to the [facility] in [place] as requested. Patient verbalized understanding. No further questions.

## 2018-12-19 NOTE — Telephone Encounter (Signed)
-----   Message from Baldo Daub, MD sent at 12/17/2018  6:04 PM EST ----- She had a brief run of PVCs. I would like her to have an echo and start a BB Acebutolol 200 mg BID and we can review in the office

## 2018-12-24 ENCOUNTER — Ambulatory Visit (HOSPITAL_COMMUNITY): Payer: BC Managed Care – PPO | Attending: Cardiology

## 2018-12-24 DIAGNOSIS — I493 Ventricular premature depolarization: Secondary | ICD-10-CM | POA: Diagnosis present

## 2018-12-24 DIAGNOSIS — R002 Palpitations: Secondary | ICD-10-CM | POA: Diagnosis present

## 2018-12-24 DIAGNOSIS — I499 Cardiac arrhythmia, unspecified: Secondary | ICD-10-CM | POA: Diagnosis present

## 2018-12-25 ENCOUNTER — Other Ambulatory Visit: Payer: Self-pay | Admitting: Obstetrics and Gynecology

## 2018-12-27 ENCOUNTER — Encounter: Payer: Self-pay | Admitting: Cardiology

## 2018-12-27 ENCOUNTER — Ambulatory Visit (INDEPENDENT_AMBULATORY_CARE_PROVIDER_SITE_OTHER): Payer: BC Managed Care – PPO | Admitting: Cardiology

## 2018-12-27 VITALS — BP 124/84 | HR 78 | Ht 67.5 in | Wt 216.5 lb

## 2018-12-27 DIAGNOSIS — J4520 Mild intermittent asthma, uncomplicated: Secondary | ICD-10-CM

## 2018-12-27 DIAGNOSIS — I493 Ventricular premature depolarization: Secondary | ICD-10-CM | POA: Diagnosis not present

## 2018-12-27 NOTE — Progress Notes (Signed)
Cardiology Office Note:    Date:  12/27/2018   ID:  Dana Diaz, DOB [birth year], MRN [id number]  PCP:  Marva Panda, NP  Cardiologist:  Norman Herrlich, MD    Referring MD: Marva Panda, NP    ASSESSMENT:    1. PVC's (premature ventricular contractions)   2. Mild intermittent asthma without complication    PLAN:    In order of problems listed above:  1. Although the frequency is low she had a 10 beat run of consecutive PVCs she has had symptoms of near syncope and previous syncope and no evidence of cardiomyopathy.  We placed her on a beta-blocker well-tolerated without exacerbating her asthma she has had a nice symptomatic response and will continue the same I asked her to avoid over-the-counter proarrhythmic drugs echocardiogram shows no evidence of cardiomyopathy and for further evaluation for catecholamine sensitive arrhythmia will undergo stress echocardiogram.  If she has exercise-induced arrhythmia or recurrent episode of syncope I will refer to my EP colleagues as individuals with structurally normal heart can have sustained ventricular arrhythmia. 2. Stable labs continue bronchodilators   Next appointment: 3 months   Medication Adjustments/Labs and Tests Ordered: Current medicines are reviewed at length with the patient today.  Concerns regarding medicines are outlined above.  Orders Placed This Encounter  Procedures  . ECHOCARDIOGRAM STRESS TEST   No orders of the defined types were placed in this encounter.   Chief Complaint  Patient presents with  . Follow-up    after echo and monitor    History of Present Illness:    Dana Diaz is a 58 y.o. female with a hx of palpitation and asthma last seen 11/15/2018.Marland Kitchen Compliance with diet, lifestyle and medications: Yes Her husband is with her today they are both interested in results of testing.  Her event monitor showed infrequent ventricular arrhythmia but if she had 110 beat run of PVCs.  Her husband  is present she has had syncope in the past and she has intermittent episodes of both palpitation and lightheadedness.  Her asthma is flared previously but is stable now and we placed her on a beta-blocker and she is very pleased with the results and has had no further episodes.  To evaluate for structural heart disease an echocardiogram was performed for all purposes normal with mild right ventricular enlargement but normal function and normal left ventricular function and without evidence of cardio myopathy.  She is reassured by these findings for further evaluation and wanted do a stress echo to look to see if there is catecholamine dependent ventricular arrhythmia and if that is present or she continues to have symptoms or syncope I will refer her to our electrophysiology colleagues. Past Medical History:  Diagnosis Date  . Asthma     Past Surgical History:  Procedure Laterality Date  . EXTERNAL EAR SURGERY    . FOOT SURGERY    . HAND SURGERY      Current Medications: Current Meds  Medication Sig  . acebutolol (SECTRAL) 200 MG capsule Take 1 capsule (200 mg total) by mouth 2 (two) times daily.  Marland Kitchen albuterol (PROVENTIL HFA;VENTOLIN HFA) 108 (90 Base) MCG/ACT inhaler Inhale into the lungs every 6 (six) hours as needed for wheezing or shortness of breath.  Marland Kitchen albuterol (PROVENTIL) (2.5 MG/3ML) 0.083% nebulizer solution Take 2.5 mg by nebulization every 6 (six) hours as needed for wheezing or shortness of breath.   Marland Kitchen azelastine (ASTELIN) 0.1 % nasal spray Place 1 spray into both nostrils daily.  Use in each nostril as directed  . budesonide-formoterol (SYMBICORT) 160-4.5 MCG/ACT inhaler Inhale 2 puffs into the lungs 2 (two) times daily as needed.  Marland Kitchen. dexlansoprazole (DEXILANT) 60 MG capsule Take 60 mg by mouth daily as needed.  . ferrous sulfate 325 (65 FE) MG tablet Take 1 tablet (325 mg total) by mouth daily.  . Olopatadine HCl 0.2 % SOLN Apply 1 drop to eye daily as needed.   . sucralfate  (CARAFATE) 1 GM/10ML suspension Take 10 mLs (1 g total) by mouth 4 (four) times daily -  with meals and at bedtime. (Patient taking differently: Take 1 g by mouth as directed. With meals and at bedtime as needed)     Allergies:   Patient has no known allergies.   Social History   Socioeconomic History  . Marital status: Single    Spouse name: Not on file  . Number of children: Not on file  . Years of education: Not on file  . Highest education level: Not on file  Occupational History  . Not on file  Social Needs  . Financial resource strain: Not on file  . Food insecurity:    Worry: Not on file    Inability: Not on file  . Transportation needs:    Medical: Not on file    Non-medical: Not on file  Tobacco Use  . Smoking status: Former Smoker    Packs/day: 1.00    Years: 1.50    Pack years: 1.50  . Smokeless tobacco: Never Used  Substance and Sexual Activity  . Alcohol use: Yes    Comment: occasionally  . Drug use: No  . Sexual activity: Not on file  Lifestyle  . Physical activity:    Days per week: Not on file    Minutes per session: Not on file  . Stress: Not on file  Relationships  . Social connections:    Talks on phone: Not on file    Gets together: Not on file    Attends religious service: Not on file    Active member of club or organization: Not on file    Attends meetings of clubs or organizations: Not on file    Relationship status: Not on file  Other Topics Concern  . Not on file  Social History Narrative  . Not on file     Family History: The patient's family history includes Breast cancer in her maternal aunt; CAD in her father; Diabetes in her brother and maternal grandmother; Irregular heart beat in her father and mother; Prostate cancer in her father; Stroke in her maternal aunt and maternal uncle. ROS:   Please see the history of present illness.    All other systems reviewed and are negative.  EKGs/Labs/Other Studies Reviewed:    The  following studies were reviewed today:    Recent Labs: No results found for requested labs within last 8760 hours.  Recent Lipid Panel No results found for: CHOL, TRIG, HDL, CHOLHDL, VLDL, LDLCALC, LDLDIRECT  Physical Exam:    VS:  BP 124/84 (BP Location: Right Arm, Patient Position: Sitting, Cuff Size: Normal)   Pulse 78   Ht 5' 7.5" (1.715 m)   Wt 216 lb 8 oz (98.2 kg)   SpO2 98%   BMI 33.41 kg/m     Wt Readings from Last 3 Encounters:  12/27/18 216 lb 8 oz (98.2 kg)  11/15/18 215 lb 12.8 oz (97.9 kg)     GEN:  Well nourished, well developed in no  acute distress HEENT: Normal NECK: No JVD; No carotid bruits LYMPHATICS: No lymphadenopathy CARDIAC: RRR, no murmurs, rubs, gallops RESPIRATORY:  Clear to auscultation without rales, wheezing or rhonchi  ABDOMEN: Soft, non-tender, non-distended MUSCULOSKELETAL:  No edema; No deformity  SKIN: Warm and dry NEUROLOGIC:  Alert and oriented x 3 PSYCHIATRIC:  Normal affect    Signed, Norman Herrlich, MD  12/27/2018 5:46 PM    [facility]

## 2018-12-27 NOTE — Patient Instructions (Addendum)
Medication Instructions:  Your physician recommends that you continue on your current medications as directed. Please refer to the Current Medication list given to you today.  If you need a refill on your cardiac medications before your next appointment, please call your pharmacy.   Lab work: NONE  If you have labs (blood work) drawn today and your tests are completely normal, you will receive your results only by: Marland Kitchen MyChart Message (if you have MyChart) OR . A paper copy in the mail If you have any lab test that is abnormal or we need to change your treatment, we will call you to review the results.  Testing/Procedures: Your physician has requested that you have a stress echocardiogram. For further information please visit [email or web address] Please follow instruction sheet as given.    Follow-Up: At [facility], you and your health needs are our priority.  As part of our continuing mission to provide you with exceptional heart care, we have created designated Provider Care Teams.  These Care Teams include your primary Cardiologist (physician) and Advanced Practice Providers (APPs -  Physician Assistants and Nurse Practitioners) who all work together to provide you with the care you need, when you need it. You will need a follow up appointment in 3 months.  Please call our office 2 months in advance to schedule this appointment.     Exercise Stress Echocardiogram  An exercise stress echocardiogram is a test to check how well your heart is working. This test uses sound waves (ultrasound) and a computer to make images of your heart before and after exercise. Ultrasound images that are taken before you exercise (your resting echocardiogram) will show how much blood is getting to your heart muscle and how well your heart muscle and heart valves are functioning. During the next part of this test, you will walk on a treadmill or ride a stationary bike to see how exercise affects your heart.  While you exercise, the electrical activity of your heart will be monitored with an electrocardiogram (ECG). Your blood pressure will also be monitored. You may have this test if you:  Have chest pain or other symptoms of a heart problem.  Recently had a heart attack or heart surgery.  Have heart valve problems.  Have a condition that causes narrowing of the blood vessels that supply your heart (coronary artery disease).  Have a high risk of heart disease and are starting a new exercise program.  Have a high risk of heart disease and need to have major surgery. Tell a health care provider about:  Any allergies you have.  All medicines you are taking, including vitamins, herbs, eye drops, creams, and over-the-counter medicines.  Any problems you or family members have had with anesthetic medicines.  Any blood disorders you have.  Any surgeries you have had.  Any medical conditions you have.  Whether you are pregnant or may be pregnant. What are the risks? Generally, this is a safe procedure. However, problems may occur, including:  Chest pain.  Dizziness or light-headedness.  Shortness of breath.  Increased or irregular heartbeat (palpitations).  Nausea or vomiting.  Heart attack (very rare). What happens before the procedure?  Follow instructions from your health care provider about eating or drinking restrictions. You may be asked to avoid all forms of caffeine for 24 hours before your procedure, or as told by your health care provider.  Ask your health care provider about changing or stopping your regular medicines. This is especially important if you  are taking diabetes medicines or blood thinners.  If you use an inhaler, bring it with you to the test.  Wear loose, comfortable clothing and walking shoes.  Do notuse any products that contain nicotine or tobacco, such as cigarettes and e-cigarettes, for 4 hours before the test or as told by your health care  provider. If you need help quitting, ask your health care provider. What happens during the procedure?  You will take off your clothes from the waist up and put on a hospital gown.  A technician will place electrodes on your chest.  A blood pressure cuff will be placed on your arm.  You will lie down on a table for an ultrasound exam before you exercise. Gel will be rubbed on your chest, and a handheld device (transducer) will be pressed against your chest and moved over your heart.  Then, you will start exercising by walking on a treadmill or pedaling a stationary bicycle.  Your blood pressure and heart rhythm will be monitored while you exercise.  The exercise will gradually get harder or faster.  You will exercise until: ? Your heart reaches a target level. ? You are too tired to continue. ? You cannot continue because of chest pain, weakness, or dizziness.  You will have another ultrasound exam after you stop exercising. The procedure may vary among health care providers and hospitals. What happens after the procedure?  Your heart rate and blood pressure will be monitored until they return to your normal levels. Summary  An exercise stress echocardiogram is a test that uses ultrasound to check how well your heart works before and after exercise.  Before the test, follow instructions from your health care provider about stopping medications, avoiding nicotine and tobacco, and avoiding certain foods and drinks.  During the test, your blood pressure and heart rhythm will be monitored while you exercise on a treadmill or stationary bicycle. This information is not intended to replace advice given to you by your health care provider. Make sure you discuss any questions you have with your health care provider. Document Released: 11/03/2004 Document Revised: 06/21/2016 Document Reviewed: 06/21/2016 Elsevier Interactive Patient Education  2019 [employer]vier Inc.       1. Avoid all  over-the-counter antihistamines except Claritin/Loratadine and Zyrtec/Cetrizine. 2. Avoid all combination including cold sinus allergies flu decongestant and sleep medications 3. You can use Robitussin DM Mucinex and Mucinex DM for cough. 4. can use Tylenol aspirin ibuprofen and naproxen but no combinations such as sleep or sinus.  KardiaMobile [email or web address]        FDA-cleared, clinical grade mobile EKG monitor: Lourena Simmonds is the most clinically-validated mobile EKG used by the world's leading cardiac care medical professionals With Basic service, know instantly if your heart rhythm is normal or if atrial fibrillation is detected, and email the last single EKG recording to yourself or your doctor Premium service, available for purchase through the Kardia app for $9.99 per month or $99 per year, includes unlimited history and storage of your EKG recordings, a monthly EKG summary report to share with your doctor, along with the ability to track your blood pressure, activity and weight Includes one KardiaMobile phone clip FREE SHIPPING: Standard delivery 1-3 business days. Orders placed by 11:00am PST will ship that afternoon. Otherwise, will ship next business day. All orders ship via [employer] from [place], [place]

## 2019-01-03 ENCOUNTER — Ambulatory Visit (HOSPITAL_BASED_OUTPATIENT_CLINIC_OR_DEPARTMENT_OTHER): Admission: RE | Admit: 2019-01-03 | Payer: BC Managed Care – PPO | Source: Ambulatory Visit

## 2019-01-28 ENCOUNTER — Other Ambulatory Visit: Payer: Self-pay | Admitting: *Deleted

## 2019-01-28 DIAGNOSIS — Z1231 Encounter for screening mammogram for malignant neoplasm of breast: Secondary | ICD-10-CM

## 2019-02-08 ENCOUNTER — Other Ambulatory Visit: Payer: Self-pay | Admitting: Cardiology

## 2019-03-03 ENCOUNTER — Ambulatory Visit: Payer: Self-pay

## 2019-04-03 NOTE — Progress Notes (Deleted)
{  Choose 1 Note Type (Telehealth Visit or Telephone Visit):[id number]}   Date:  04/03/2019   ID:  Dana Diaz, DOB Jul 05, 1961, MRN [id number]  {Patient Location:[id number]::"Home"} {Provider [phone]::"Home"}  PCP:  Marva Panda, NP  Cardiologist:  Norman Herrlich, MD *** Electrophysiologist:  None   Evaluation Performed:  {Choose Visit Type:[phone]::"Follow-Up Visit"}  Chief Complaint:  ***  History of Present Illness:    Dana Diaz is a 58 y.o. female with  palpitation and asthma last seen 12/27/18 with symptomatic PVC's.To evaluate for structural heart disease an echocardiogram was performed for all purposes normal with mild right ventricular enlargement but normal function and normal left ventricular function and without evidence of  cardio myopathy.     The patient {does/does not:200015} have symptoms concerning for COVID-19 infection (fever, chills, cough, or new shortness of breath).    Past Medical History:  Diagnosis Date  . Asthma    Past Surgical History:  Procedure Laterality Date  . EXTERNAL EAR SURGERY    . FOOT SURGERY    . HAND SURGERY       No outpatient medications have been marked as taking for the 04/04/19 encounter (Appointment) with Baldo Daub, MD.     Allergies:   Patient has no known allergies.   Social History   Tobacco Use  . Smoking status: Former Smoker    Packs/day: 1.00    Years: 1.50    Pack years: 1.50  . Smokeless tobacco: Never Used  Substance Use Topics  . Alcohol use: Yes    Comment: occasionally  . Drug use: No     Family Hx: The patient's family history includes Breast cancer in her maternal aunt; CAD in her father; Diabetes in her brother and maternal grandmother; Irregular heart beat in her father and mother; Prostate cancer in her father; Stroke in her maternal aunt and maternal uncle.  ROS:   Please see the history of present illness.    *** All other systems reviewed and are  negative.   Prior CV studies:   The following studies were reviewed today:  ***  Labs/Other Tests and Data Reviewed:    EKG:  {EKG/Telemetry Strips Reviewed:[phone]}  Recent Labs: No results found for requested labs within last 8760 hours.   Recent Lipid Panel No results found for: CHOL, TRIG, HDL, CHOLHDL, LDLCALC, LDLDIRECT  Wt Readings from Last 3 Encounters:  12/27/18 216 lb 8 oz (98.2 kg)  11/15/18 215 lb 12.8 oz (97.9 kg)     Objective:    Vital Signs:  There were no vitals taken for this visit.   {HeartCare Virtual Exam (Optional):[id number]::"VITAL SIGNS:  reviewed"}  ASSESSMENT & PLAN:    1. ***  COVID-19 Education: The signs and symptoms of COVID-19 were discussed with the patient and how to seek care for testing (follow up with PCP or arrange E-visit).  ***The importance of social distancing was discussed today.  Time:   Today, I have spent *** minutes with the patient with telehealth technology discussing the above problems.     Medication Adjustments/Labs and Tests Ordered: Current medicines are reviewed at length with the patient today.  Concerns regarding medicines are outlined above.   Tests Ordered: No orders of the defined types were placed in this encounter.   Medication Changes: No orders of the defined types were placed in this encounter.   Disposition:  Follow up {follow up:15908}  Signed, Norman Herrlich, MD  04/03/2019 3:26 PM    [facility]

## 2019-04-04 ENCOUNTER — Telehealth: Payer: BC Managed Care – PPO | Admitting: Cardiology

## 2019-04-04 ENCOUNTER — Other Ambulatory Visit: Payer: Self-pay

## 2019-04-15 ENCOUNTER — Other Ambulatory Visit: Payer: Self-pay

## 2019-04-15 ENCOUNTER — Ambulatory Visit
Admission: RE | Admit: 2019-04-15 | Discharge: 2019-04-15 | Disposition: A | Discharge status: Home / Self Care | Payer: BC Managed Care – PPO | Source: Ambulatory Visit | Attending: *Deleted | Admitting: *Deleted

## 2019-04-15 DIAGNOSIS — Z1231 Encounter for screening mammogram for malignant neoplasm of breast: Secondary | ICD-10-CM

## 2019-04-16 ENCOUNTER — Other Ambulatory Visit: Payer: Self-pay | Admitting: *Deleted

## 2019-04-16 DIAGNOSIS — R928 Other abnormal and inconclusive findings on diagnostic imaging of breast: Secondary | ICD-10-CM

## 2019-04-30 ENCOUNTER — Ambulatory Visit
Admission: RE | Admit: 2019-04-30 | Discharge: 2019-04-30 | Disposition: A | Discharge status: Home / Self Care | Payer: BC Managed Care – PPO | Source: Ambulatory Visit | Attending: *Deleted | Admitting: *Deleted

## 2019-04-30 ENCOUNTER — Other Ambulatory Visit: Payer: Self-pay

## 2019-04-30 DIAGNOSIS — R928 Other abnormal and inconclusive findings on diagnostic imaging of breast: Secondary | ICD-10-CM

## 2019-05-02 ENCOUNTER — Other Ambulatory Visit: Payer: Self-pay | Admitting: Cardiology

## 2019-05-24 ENCOUNTER — Other Ambulatory Visit: Payer: Self-pay | Admitting: Cardiology

## 2019-06-21 ENCOUNTER — Other Ambulatory Visit: Payer: Self-pay | Admitting: Cardiology

## 2019-07-29 ENCOUNTER — Other Ambulatory Visit: Payer: Self-pay | Admitting: Cardiology

## 2019-10-13 ENCOUNTER — Other Ambulatory Visit: Payer: Self-pay

## 2019-10-13 DIAGNOSIS — Z20822 Contact with and (suspected) exposure to covid-19: Secondary | ICD-10-CM

## 2019-10-14 LAB — NOVEL CORONAVIRUS, NAA: SARS-CoV-2, NAA: NOT DETECTED

## 2019-10-15 ENCOUNTER — Telehealth: Payer: Self-pay | Admitting: *Deleted

## 2019-10-15 NOTE — Telephone Encounter (Signed)
° °  Pt rec neg COVID results °

## 2019-10-23 ENCOUNTER — Other Ambulatory Visit: Payer: Self-pay

## 2019-10-23 DIAGNOSIS — Z20822 Contact with and (suspected) exposure to covid-19: Secondary | ICD-10-CM

## 2019-10-25 LAB — NOVEL CORONAVIRUS, NAA: SARS-CoV-2, NAA: NOT DETECTED

## 2020-02-05 ENCOUNTER — Other Ambulatory Visit: Payer: Self-pay | Admitting: *Deleted

## 2020-02-25 ENCOUNTER — Telehealth: Payer: Self-pay | Admitting: Cardiology

## 2020-02-25 NOTE — Telephone Encounter (Signed)
OK with me.

## 2020-02-25 NOTE — Telephone Encounter (Signed)
New Message:     Pt is wanting to change doctor and see a Development worker, international aid in our [facility]. She lives in [place] and it would be closer for her. Is this alright with you?

## 2020-02-25 NOTE — Telephone Encounter (Signed)
Yes it sounds like it make her health care easier

## 2020-02-25 NOTE — Telephone Encounter (Signed)
Message sent to scheduling to arrange  

## 2020-03-01 NOTE — Telephone Encounter (Signed)
OK with me.

## 2020-03-01 NOTE — Telephone Encounter (Signed)
Patient would like to see a provider in the [address] Dr. Dulce Sellar has agreed to the switch already are you in agreement?

## 2020-03-05 ENCOUNTER — Ambulatory Visit: Payer: BC Managed Care – PPO | Admitting: Cardiovascular Disease

## 2020-03-10 ENCOUNTER — Other Ambulatory Visit: Payer: Self-pay | Admitting: *Deleted

## 2020-03-10 DIAGNOSIS — Z1231 Encounter for screening mammogram for malignant neoplasm of breast: Secondary | ICD-10-CM

## 2020-04-15 ENCOUNTER — Ambulatory Visit: Payer: BC Managed Care – PPO

## 2020-04-16 ENCOUNTER — Ambulatory Visit: Payer: BC Managed Care – PPO

## 2020-04-19 ENCOUNTER — Other Ambulatory Visit: Payer: Self-pay

## 2020-04-19 ENCOUNTER — Ambulatory Visit
Admission: RE | Admit: 2020-04-19 | Discharge: 2020-04-19 | Disposition: A | Discharge status: Home / Self Care | Payer: BC Managed Care – PPO | Source: Ambulatory Visit | Attending: *Deleted | Admitting: *Deleted

## 2020-04-19 DIAGNOSIS — Z1231 Encounter for screening mammogram for malignant neoplasm of breast: Secondary | ICD-10-CM

## 2020-05-24 NOTE — Progress Notes (Signed)
Cardiology Office Note:    Date:  05/26/2020   ID:  Dana Diaz, DOB 02-Nov-1961, MRN [id number]  PCP:  Marva Panda, NP  Cardiologist:  Norman Herrlich, MD   Referring MD: Marva Panda, NP   Chief Complaint  Patient presents with  . Advice Only    Palpitations/weakness/dizziness    History of Present Illness:    Dana Diaz is a 59 y.o. female with a hx of asthma and recent palpitations for which she seeks advice at the request of Marva Panda, NP.  The patient has seen Dr. Dulce Sellar in the past.  She then saw Dr. Jacinto Halim.  Her concern has been palpitations, separate episodes of feeling dizzy as if blood is rushing to her head, and irregular heartbeats noted when she monitors her blood pressure even if she is not having palpitations.  The symptoms were evaluated by Dr. Dulce Sellar in 2019.  She had PVCs noted on a 7-day monitor with a 10 beat run of wide-complex tachycardia that was not recognized as a symptom.  An echocardiogram was also performed and did not reveal any structural abnormality.  Physical activity such as climbing stairs causes the heart to race, she occasionally feels dizzy, and frequently has to stop and rest.  She has to walk 3 flight of stairs to get to her apartment.  This is increasingly becoming a struggle.  Past Medical History:  Diagnosis Date  . Asthma     Past Surgical History:  Procedure Laterality Date  . EXTERNAL EAR SURGERY    . FOOT SURGERY    . HAND SURGERY      Current Medications: Current Meds  Medication Sig  . albuterol (PROVENTIL HFA;VENTOLIN HFA) 108 (90 Base) MCG/ACT inhaler Inhale into the lungs every 6 (six) hours as needed for wheezing or shortness of breath.  Marland Kitchen albuterol (PROVENTIL) (2.5 MG/3ML) 0.083% nebulizer solution Take 2.5 mg by nebulization every 6 (six) hours as needed for wheezing or shortness of breath.   . dexlansoprazole (DEXILANT) 60 MG capsule Take 60 mg by mouth daily as needed.  . fluticasone (FLONASE) 50  MCG/ACT nasal spray Place 2 sprays into both nostrils daily.  . Olopatadine HCl 0.2 % SOLN Apply 1 drop to eye daily as needed.      Allergies:   Patient has no known allergies.   Social History   Socioeconomic History  . Marital status: Single    Spouse name: Not on file  . Number of children: Not on file  . Years of education: Not on file  . Highest education level: Not on file  Occupational History  . Not on file  Tobacco Use  . Smoking status: Former Smoker    Packs/day: 1.00    Years: 1.50    Pack years: 1.50  . Smokeless tobacco: Never Used  Vaping Use  . Vaping Use: Never used  Substance and Sexual Activity  . Alcohol use: Yes    Comment: occasionally  . Drug use: No  . Sexual activity: Not on file  Other Topics Concern  . Not on file  Social History Narrative  . Not on file   Social Determinants of Health   Financial Resource Strain:   . Difficulty of Paying Living Expenses:   Food Insecurity:   . Worried About Programme researcher, broadcasting/film/video in the Last Year:   . Barista in the Last Year:   Transportation Needs:   . Freight forwarder (Medical):   Marland Kitchen Lack of  Transportation (Non-Medical):   Physical Activity:   . Days of Exercise per Week:   . Minutes of Exercise per Session:   Stress:   . Feeling of Stress :   Social Connections:   . Frequency of Communication with Friends and Family:   . Frequency of Social Gatherings with Friends and Family:   . Attends Religious Services:   . Active Member of Clubs or Organizations:   . Attends Banker Meetings:   Marland Kitchen Marital Status:      Family History: The patient's family history includes Breast cancer in her maternal aunt; CAD in her father; Diabetes in her brother and maternal grandmother; Irregular heart beat in her father and mother; Prostate cancer in her father; Stroke in her maternal aunt and maternal uncle.  ROS:   Please see the history of present illness.    Anxiety concerning her  health.  Dr. Jacinto Halim wanted to do a stress test but she cannot afford it.  All other systems reviewed and are negative.  EKGs/Labs/Other Studies Reviewed:    The following studies were reviewed today:  2D Doppler echocardiogram 2020 IMPRESSIONS    1. The left ventricle has normal systolic function of 60-65%. The cavity  size was normal. Left ventricular diastolic Doppler parameters are  consistent with impaired relaxation.  2. The right ventricle has normal systolic function. The cavity was  mildly enlarged. There is no increase in right ventricular wall thickness.  Right ventricular systolic pressure is normal.  3. The mitral valve is normal in structure.  4. The tricuspid valve is normal in structure.  5. The aortic valve is tricuspid.  6. The pulmonic valve was normal in structure.   Long-term monitor 2020: Study Highlights  A ZIO monitor was performed 13 days 7 hours initiating 11/26/2018 to assess palpitation.  The rhythm throughout is sinus with minimum average and maximum heart rates of 60, 92 and 162 bpm.  There is one triggered event sinus tachycardia rate of 136 bpm.  There were no pauses of 3 seconds or greater or episodes of AV node or sinus node block.  Although ventricular ectopy is rare with PVCs and couplet there is 110 beat run of PVCs right bundle branch morphology with fusion beats at the beginning and end in A-V dissociation.  Cycle length was 450 ms.  Supraventricular ectopy is rare without episodes of atrial fibrillation or flutter   Conclusion overall rare ventricular and supraventricular arrhythmia although there is one 10 beat run of PVCs asymptomatic.   EKG:  EKG normal sinus rhythm with left axis deviation and poor R wave progression.  Overall essentially normal.  Recent Labs: No results found for requested labs within last 8760 hours.  Recent Lipid Panel No results found for: CHOL, TRIG, HDL, CHOLHDL, VLDL, LDLCALC,  LDLDIRECT  Physical Exam:    VS:  BP 126/82   Pulse 88   Ht 5\' 7"  (1.702 m)   Wt 212 lb 12.8 oz (96.5 kg)   LMP 07/28/2018 (Approximate)   SpO2 95%   BMI 33.33 kg/m     Wt Readings from Last 3 Encounters:  05/26/20 212 lb 12.8 oz (96.5 kg)  12/27/18 216 lb 8 oz (98.2 kg)  11/15/18 215 lb 12.8 oz (97.9 kg)     GEN: Moderate obesity. No acute distress HEENT: Normal NECK: No JVD. LYMPHATICS: No lymphadenopathy CARDIAC:  RRR without murmur, gallop, or edema. VASCULAR:  Normal Pulses. No bruits. RESPIRATORY:  Clear to auscultation without rales, wheezing or  rhonchi  ABDOMEN: Soft, non-tender, non-distended, No pulsatile mass, MUSCULOSKELETAL: No deformity  SKIN: Warm and dry NEUROLOGIC:  Alert and oriented x 3 PSYCHIATRIC:  Normal affect   ASSESSMENT:    1. Palpitations   2. PVC's (premature ventricular contractions)   3. Other chest pain   4. Mild intermittent asthma without complication   5. Educated about COVID-19 virus infection   6. Other fatigue    PLAN:    In order of problems listed above:  1. PVCs were noted on monitoring.  Heart was structurally normal on echo done remotely.  There is a significant family of CAD on both the mother and father's side of the family.  Both her parents have had coronary bypass grafting.  Plan to perform an exercise treadmill test.  We will also request a coronary calcium score.  Exertional intolerance, dyspnea, may be related to ischemic heart disease.  There is no structural abnormality noted on echo.  PVCs could also be ischemia mediated. 2. Avoid caffeine.  Use Ventolin inhaler as infrequently as possible.  Talked about good sleep hygiene.  She does not drink alcohol.  Continue to observe. 3. Given family history of vascular disease, a coronary calcium score be obtained and if abnormal further testing in addition to an exercise treadmill test may be required. 4. Not discussed 5. She has been vaccinated   Medication  Adjustments/Labs and Tests Ordered: Current medicines are reviewed at length with the patient today.  Concerns regarding medicines are outlined above.  Orders Placed This Encounter  Procedures  . CT CARDIAC SCORING  . Exercise Tolerance Test  . EKG 12-Lead   No orders of the defined types were placed in this encounter.   Patient Instructions  Medication Instructions:  Your physician recommends that you continue on your current medications as directed. Please refer to the Current Medication list given to you today.  *If you need a refill on your cardiac medications before your next appointment, please call your pharmacy*   Lab Work: None If you have labs (blood work) drawn today and your tests are completely normal, you will receive your results only by: Marland Kitchen MyChart Message (if you have MyChart) OR . A paper copy in the mail If you have any lab test that is abnormal or we need to change your treatment, we will call you to review the results.   Testing/Procedures: Your physician has requested that you have an exercise tolerance test. For further information please visit [email or web address] Please also follow instruction sheet, as given.  Your physician recommends that you have a Calcium Score performed.   Follow-Up: At [facility], you and your health needs are our priority.  As part of our continuing mission to provide you with exceptional heart care, we have created designated Provider Care Teams.  These Care Teams include your primary Cardiologist (physician) and Advanced Practice Providers (APPs -  Physician Assistants and Nurse Practitioners) who all work together to provide you with the care you need, when you need it.  We recommend signing up for the patient portal called "MyChart".  Sign up information is provided on this After Visit Summary.  MyChart is used to connect with patients for Virtual Visits (Telemedicine).  Patients are able to view lab/test results, encounter  notes, upcoming appointments, etc.  Non-urgent messages can be sent to your provider as well.   To learn more about what you can do with MyChart, go to [email or web address].    Your next appointment:   As needed  The format for your next appointment:   In Person  Provider:   You may see Dr. Verdis PrimeHenry Kieara Schwark or one of the following Advanced Practice Providers on your designated Care Team:    Norma FredricksonLori Gerhardt, NP  Nada BoozerLaura Ingold, NP  Georgie ChardJill McDaniel, NP    Other Instructions      Signed, Lesleigh NoeHenry W Jezel Basto III, MD  05/26/2020 5:40 PM    [facility]

## 2020-05-26 ENCOUNTER — Ambulatory Visit (INDEPENDENT_AMBULATORY_CARE_PROVIDER_SITE_OTHER): Payer: BC Managed Care – PPO | Admitting: Interventional Cardiology

## 2020-05-26 ENCOUNTER — Other Ambulatory Visit: Payer: Self-pay

## 2020-05-26 ENCOUNTER — Encounter: Payer: Self-pay | Admitting: Interventional Cardiology

## 2020-05-26 VITALS — BP 126/82 | HR 88 | Ht 67.0 in | Wt 212.8 lb

## 2020-05-26 DIAGNOSIS — I493 Ventricular premature depolarization: Secondary | ICD-10-CM

## 2020-05-26 DIAGNOSIS — R0789 Other chest pain: Secondary | ICD-10-CM | POA: Diagnosis not present

## 2020-05-26 DIAGNOSIS — J4520 Mild intermittent asthma, uncomplicated: Secondary | ICD-10-CM

## 2020-05-26 DIAGNOSIS — R5383 Other fatigue: Secondary | ICD-10-CM

## 2020-05-26 DIAGNOSIS — R002 Palpitations: Secondary | ICD-10-CM

## 2020-05-26 DIAGNOSIS — Z7189 Other specified counseling: Secondary | ICD-10-CM

## 2020-05-26 NOTE — Patient Instructions (Signed)
Medication Instructions:  Your physician recommends that you continue on your current medications as directed. Please refer to the Current Medication list given to you today.  *If you need a refill on your cardiac medications before your next appointment, please call your pharmacy*   Lab Work: None If you have labs (blood work) drawn today and your tests are completely normal, you will receive your results only by: Marland Kitchen MyChart Message (if you have MyChart) OR . A paper copy in the mail If you have any lab test that is abnormal or we need to change your treatment, we will call you to review the results.   Testing/Procedures: Your physician has requested that you have an exercise tolerance test. For further information please visit [email or web address] Please also follow instruction sheet, as given.  Your physician recommends that you have a Calcium Score performed.   Follow-Up: At [facility], you and your health needs are our priority.  As part of our continuing mission to provide you with exceptional heart care, we have created designated Provider Care Teams.  These Care Teams include your primary Cardiologist (physician) and Advanced Practice Providers (APPs -  Physician Assistants and Nurse Practitioners) who all work together to provide you with the care you need, when you need it.  We recommend signing up for the patient portal called "MyChart".  Sign up information is provided on this After Visit Summary.  MyChart is used to connect with patients for Virtual Visits (Telemedicine).  Patients are able to view lab/test results, encounter notes, upcoming appointments, etc.  Non-urgent messages can be sent to your provider as well.   To learn more about what you can do with MyChart, go to [email or web address].    Your next appointment:   As needed  The format for your next appointment:   In Person  Provider:   You may see Dr. Verdis Prime or one of the following Advanced Practice  Providers on your designated Care Team:    Norma Fredrickson, NP  Nada Boozer, NP  Georgie Chard, NP    Other Instructions

## 2020-06-15 ENCOUNTER — Ambulatory Visit (INDEPENDENT_AMBULATORY_CARE_PROVIDER_SITE_OTHER)
Admission: RE | Admit: 2020-06-15 | Discharge: 2020-06-15 | Disposition: A | Discharge status: Home / Self Care | Payer: Self-pay | Source: Ambulatory Visit | Attending: Interventional Cardiology | Admitting: Interventional Cardiology

## 2020-06-15 ENCOUNTER — Ambulatory Visit (INDEPENDENT_AMBULATORY_CARE_PROVIDER_SITE_OTHER): Payer: BC Managed Care – PPO

## 2020-06-15 ENCOUNTER — Other Ambulatory Visit: Payer: Self-pay

## 2020-06-15 DIAGNOSIS — R002 Palpitations: Secondary | ICD-10-CM | POA: Diagnosis not present

## 2020-06-15 DIAGNOSIS — R0789 Other chest pain: Secondary | ICD-10-CM | POA: Diagnosis not present

## 2020-06-15 DIAGNOSIS — R5383 Other fatigue: Secondary | ICD-10-CM | POA: Diagnosis not present

## 2020-06-15 LAB — EXERCISE TOLERANCE TEST
Estimated workload: 8.6 METS
Exercise duration (min): 6 min
Exercise duration (sec): 32 s
MPHR: 163 {beats}/min
Peak HR: 151 {beats}/min
Percent HR: 92 %
RPE: 15
Rest HR: 86 {beats}/min

## 2020-06-18 ENCOUNTER — Telehealth: Payer: Self-pay

## 2020-06-18 ENCOUNTER — Telehealth: Payer: Self-pay | Admitting: Interventional Cardiology

## 2020-06-18 DIAGNOSIS — R0602 Shortness of breath: Secondary | ICD-10-CM

## 2020-06-18 NOTE — Telephone Encounter (Signed)
-----   Message from Henrietta Dine, RN sent at 06/16/2020  3:17 PM EDT ----- Per Dr. Katrinka Blazing, "There is no evidence of blockage from the coronary CT"  Left message to call back.

## 2020-06-18 NOTE — Telephone Encounter (Signed)
The patient has been notified of the result and verbalized understanding.  All questions (if any) were answered. Leanord Hawking, RN 06/18/2020 8:17 AM

## 2020-06-18 NOTE — Telephone Encounter (Signed)
I spoke with patient.  She reports she has trouble breathing when lying flat.  Had trouble lying flat for CT scan.  Happens most nights. History of asthma. Sleeps with 2 pillows propping her up. Has been going on for about a year.  Sometimes she will feel palpitations when lying flat but not always.  Sometimes when she gets up and checks her BP cuff it will show triple heart beats but not every time.  Also feels tired and short of breath when climbing stairs especially if she is carrying groceries.  Shortness of breath occurs also when it is hot or cold.  She discussed shortness of breath when climbing stairs with Dr Katrinka Blazing but does not think she talked with him about difficulty lying flat.  She is asking what could be causing this.

## 2020-06-18 NOTE — Telephone Encounter (Signed)
    Pt would like to ask Dr. Katrinka Blazing to why he cant breath when she lay flat

## 2020-06-20 NOTE — Telephone Encounter (Signed)
I don't know why she feels SOB when laying down. Needs BNP, PA and Lat CXR. Awaiting ETT.

## 2020-06-21 NOTE — Telephone Encounter (Signed)
Do you still want CXR and BNP?  ETT and Calcium Score were fine.

## 2020-06-22 NOTE — Telephone Encounter (Signed)
Yes

## 2020-06-23 NOTE — Telephone Encounter (Signed)
Left message to call back.  Orders placed for CXR and Pro BNP.

## 2020-06-24 NOTE — Telephone Encounter (Signed)
Spoke with pt and made her aware of recommendations.  Pt agreeable to plan.  She will come tomorrow for labs and then go to [facility] for CXR.

## 2020-06-25 ENCOUNTER — Other Ambulatory Visit: Payer: Self-pay

## 2020-06-25 ENCOUNTER — Other Ambulatory Visit: Payer: BC Managed Care – PPO | Admitting: *Deleted

## 2020-06-25 ENCOUNTER — Ambulatory Visit
Admission: RE | Admit: 2020-06-25 | Discharge: 2020-06-25 | Disposition: A | Discharge status: Home / Self Care | Payer: BC Managed Care – PPO | Source: Ambulatory Visit | Attending: Interventional Cardiology | Admitting: Interventional Cardiology

## 2020-06-25 DIAGNOSIS — R0602 Shortness of breath: Secondary | ICD-10-CM

## 2020-06-26 LAB — PRO B NATRIURETIC PEPTIDE: NT-Pro BNP: 67 pg/mL (ref 0–287)

## 2020-07-08 ENCOUNTER — Other Ambulatory Visit: Payer: Self-pay | Admitting: Obstetrics and Gynecology

## 2020-07-09 ENCOUNTER — Other Ambulatory Visit: Payer: Self-pay | Admitting: Obstetrics and Gynecology

## 2020-07-09 DIAGNOSIS — Z780 Asymptomatic menopausal state: Secondary | ICD-10-CM

## 2020-07-21 ENCOUNTER — Other Ambulatory Visit: Payer: Self-pay | Admitting: Obstetrics and Gynecology

## 2020-07-21 DIAGNOSIS — Z780 Asymptomatic menopausal state: Secondary | ICD-10-CM

## 2020-08-04 ENCOUNTER — Ambulatory Visit
Admission: RE | Admit: 2020-08-04 | Discharge: 2020-08-04 | Disposition: A | Discharge status: Home / Self Care | Payer: BC Managed Care – PPO | Source: Ambulatory Visit | Attending: Obstetrics and Gynecology | Admitting: Obstetrics and Gynecology

## 2020-08-04 ENCOUNTER — Other Ambulatory Visit: Payer: Self-pay

## 2020-08-04 DIAGNOSIS — Z780 Asymptomatic menopausal state: Secondary | ICD-10-CM

## 2020-09-27 ENCOUNTER — Telehealth: Payer: Self-pay | Admitting: Interventional Cardiology

## 2020-09-27 NOTE — Telephone Encounter (Signed)
Patient requesting to switch cardiologists from Dr. Katrinka Blazing to Dr. Dulce Sellar.

## 2020-09-27 NOTE — Telephone Encounter (Signed)
OK 

## 2020-09-28 NOTE — Telephone Encounter (Signed)
Okay with me 

## 2020-10-15 DIAGNOSIS — J45909 Unspecified asthma, uncomplicated: Secondary | ICD-10-CM | POA: Insufficient documentation

## 2020-10-26 ENCOUNTER — Ambulatory Visit: Payer: BC Managed Care – PPO | Admitting: Cardiology

## 2020-11-15 NOTE — Progress Notes (Addendum)
Cardiology Office Note:    Date:  11/16/2020   ID:  Dana Diaz, DOB [birth year], MRN [id number]  PCP:  Marva Panda, NP  Cardiologist:  Norman Herrlich, MD    Referring MD: Marva Panda, NP    ASSESSMENT:    1. Palpitations   2. PVC's (premature ventricular contractions)   3. Asthma, unspecified asthma severity, unspecified whether complicated, unspecified whether persistent    PLAN:    In order of problems listed above:  1. She has ongoing episodes of palpitation clinically I suspect she is having atrial tachyarrhythmia.  We will utilize a live event monitor for 2 weeks and if we do not capture the episodes will refer to EP.  An option would be an implanted loop recorder, and as she is stable I will give her a prescription for low-dose metoprolol she can take as needed.   Next appointment: 4 weeks   Medication Adjustments/Labs and Tests Ordered: Current medicines are reviewed at length with the patient today.  Concerns regarding medicines are outlined above.  No orders of the defined types were placed in this encounter.  No orders of the defined types were placed in this encounter.   Chief Complaint  Patient presents with  . Follow-up  . Palpitations    History of Present Illness:    Dana Diaz is a 60 y.o. female with a hx of asthma and palpitation with PVCs last seen by Dr. Katrinka Blazing 05/26/2020.  She underwent treadmill stress test 06/15/2020 which was normal with no ischemia noted normal exercise tolerance and no arrhythmia.  Compliance with diet, lifestyle and medications: Yes  Initially responded to beta-blocker but stopped because of constipation  Continues to have intermittent rapid heart rhythm recently at a particularly severe episode associated with stress with a heart rate of 165 bpm near loss of consciousness she was very anxious with it and short of breath. Her concern is it would not capture her arrhythmia I agreeable and we will provide her  with a live ZIO monitor today and if unremarkable refer to EP.  Another option would be an implanted loop recorder.  Have given her prescription for as needed metoprolol which she can take.  Reviewed her testing with her and I do not think she needs an ischemia evaluation. At this time her asthma is stable and uses her inhaler infrequently   The following studies were reviewed today:  2D Doppler echocardiogram 2020 IMPRESSIONS   1. The left ventricle has normal systolic function of 60-65%. The cavity  size was normal. Left ventricular diastolic Doppler parameters are  consistent with impaired relaxation.  2. The right ventricle has normal systolic function. The cavity was  mildly enlarged. There is no increase in right ventricular wall thickness.  Right ventricular systolic pressure is normal.  3. The mitral valve is normal in structure.  4. The tricuspid valve is normal in structure.  5. The aortic valve is tricuspid.  6. The pulmonic valve was normal in structure.   Long-term monitor 2020: Study Highlights  A ZIO monitor was performed 13 days 7 hours initiating 11/26/2018 to assess palpitation. The rhythm throughout is sinus with minimum average and maximum heart rates of 60, 92 and 162 bpm. There is one triggered event sinus tachycardia rate of 136 bpm. There were no pauses of 3 seconds or greater or episodes of AV node or sinus node block. Although ventricular ectopy is rare with PVCs and couplet there is 110 beat run of PVCs right bundle  branch morphology with fusion beats at the beginning and end in A-V dissociation. Cycle length was 450 ms. Supraventricular ectopy is rare without episodes of atrial fibrillation or flutter  Conclusion overall rare ventricular and supraventricular arrhythmia although there is one 10 beat run of PVCs asymptomatic.  Calcium score was 0 06/15/2020   Past Medical History:  Diagnosis Date  . Asthma     Past Surgical History:   Procedure Laterality Date  . EXTERNAL EAR SURGERY    . FOOT SURGERY    . HAND SURGERY      Current Medications: Current Meds  Medication Sig  . albuterol (PROVENTIL HFA;VENTOLIN HFA) 108 (90 Base) MCG/ACT inhaler Inhale into the lungs every 6 (six) hours as needed for wheezing or shortness of breath.  Marland Kitchen albuterol (PROVENTIL) (2.5 MG/3ML) 0.083% nebulizer solution Take 2.5 mg by nebulization every 6 (six) hours as needed for wheezing or shortness of breath.   . dexlansoprazole (DEXILANT) 60 MG capsule Take 60 mg by mouth daily as needed.  Marland Kitchen estradiol (ESTRACE) 0.1 MG/GM vaginal cream Place vaginally once a week.  . fluticasone (FLONASE) 50 MCG/ACT nasal spray Place 2 sprays into both nostrils daily.  . Olopatadine HCl 0.2 % SOLN Apply 1 drop to eye daily as needed.   . Vitamin D, Ergocalciferol, (DRISDOL) 1.25 MG (50000 UNIT) CAPS capsule Take 50,000 Units by mouth once a week.  Marland Kitchen XIIDRA 5 % SOLN Place into both eyes daily.     Allergies:   Patient has no known allergies.   Social History   Socioeconomic History  . Marital status: Single    Spouse name: Not on file  . Number of children: Not on file  . Years of education: Not on file  . Highest education level: Not on file  Occupational History  . Not on file  Tobacco Use  . Smoking status: Former Smoker    Packs/day: 1.00    Years: 1.50    Pack years: 1.50  . Smokeless tobacco: Never Used  Vaping Use  . Vaping Use: Never used  Substance and Sexual Activity  . Alcohol use: Yes    Comment: occasionally  . Drug use: No  . Sexual activity: Not on file  Other Topics Concern  . Not on file  Social History Narrative  . Not on file   Social Determinants of Health   Financial Resource Strain: Not on file  Food Insecurity: Not on file  Transportation Needs: Not on file  Physical Activity: Not on file  Stress: Not on file  Social Connections: Not on file     Family History: The patient's family history includes  Breast cancer in her maternal aunt; CAD in her father; Diabetes in her brother and maternal grandmother; Irregular heart beat in her father and mother; Prostate cancer in her father; Stroke in her maternal aunt and maternal uncle. ROS:   Please see the history of present illness.    All other systems reviewed and are negative.  EKGs/Labs/Other Studies Reviewed:    The following studies were reviewed today:  EKG:  EKG ordered today and personally reviewed.  The ekg ordered today demonstrates sinus rhythm normal  Recent Labs: 06/25/2020: NT-Pro BNP 67  Recent Lipid Panel No results found for: CHOL, TRIG, HDL, CHOLHDL, VLDL, LDLCALC, LDLDIRECT  Physical Exam:    VS:  BP (!) 150/72   Pulse 88   Ht 5\' 7"  (1.702 m)   Wt 218 lb (98.9 kg)   LMP 07/28/2018 (Approximate)  SpO2 99%   BMI 34.14 kg/m     Wt Readings from Last 3 Encounters:  11/16/20 218 lb (98.9 kg)  05/26/20 212 lb 12.8 oz (96.5 kg)  12/27/18 216 lb 8 oz (98.2 kg)     GEN:  Well nourished, well developed in no acute distress HEENT: Normal NECK: No JVD; No carotid bruits LYMPHATICS: No lymphadenopathy CARDIAC: RRR, no murmurs, rubs, gallops RESPIRATORY:  Clear to auscultation without rales, wheezing or rhonchi  ABDOMEN: Soft, non-tender, non-distended MUSCULOSKELETAL:  No edema; No deformity  SKIN: Warm and dry NEUROLOGIC:  Alert and oriented x 3 PSYCHIATRIC:  Normal affect    Signed, Norman Herrlich, MD  11/16/2020 2:59 PM    [facility]

## 2020-11-16 ENCOUNTER — Ambulatory Visit (INDEPENDENT_AMBULATORY_CARE_PROVIDER_SITE_OTHER): Payer: BC Managed Care – PPO | Admitting: Cardiology

## 2020-11-16 ENCOUNTER — Ambulatory Visit (INDEPENDENT_AMBULATORY_CARE_PROVIDER_SITE_OTHER): Payer: BC Managed Care – PPO

## 2020-11-16 ENCOUNTER — Encounter: Payer: Self-pay | Admitting: Cardiology

## 2020-11-16 ENCOUNTER — Other Ambulatory Visit: Payer: Self-pay

## 2020-11-16 VITALS — BP 150/72 | HR 88 | Ht 67.0 in | Wt 218.0 lb

## 2020-11-16 DIAGNOSIS — J45909 Unspecified asthma, uncomplicated: Secondary | ICD-10-CM

## 2020-11-16 DIAGNOSIS — I493 Ventricular premature depolarization: Secondary | ICD-10-CM

## 2020-11-16 DIAGNOSIS — R002 Palpitations: Secondary | ICD-10-CM

## 2020-11-16 MED ORDER — METOPROLOL SUCCINATE ER 25 MG PO TB24: 12.5000 mg | ORAL_TABLET | Freq: Every day | ORAL | 3 refills | Status: DC | PRN

## 2020-11-16 NOTE — Patient Instructions (Signed)
Medication Instructions:  Your physician has recommended you make the following change in your medication:  START: Metoprolol 12.5 mg take 0.5 tablet by mouth daily as needed for palpitations.  *If you need a refill on your cardiac medications before your next appointment, please call your pharmacy*   Lab Work: None If you have labs (blood work) drawn today and your tests are completely normal, you will receive your results only by: Marland Kitchen MyChart Message (if you have MyChart) OR . A paper copy in the mail If you have any lab test that is abnormal or we need to change your treatment, we will call you to review the results.   Testing/Procedures: A zio monitor was ordered today. It will remain on for 14 days. You will then return monitor and event diary in provided box. It takes 1-2 weeks for report to be downloaded and returned to [place]. We will call you with the results. If monitor falls off or has orange flashing light, please call Zio for further instructions.      Follow-Up: At [facility], you and your health needs are our priority.  As part of our continuing mission to provide you with exceptional heart care, we have created designated Provider Care Teams.  These Care Teams include your primary Cardiologist (physician) and Advanced Practice Providers (APPs -  Physician Assistants and Nurse Practitioners) who all work together to provide you with the care you need, when you need it.  We recommend signing up for the patient portal called "MyChart".  Sign up information is provided on this After Visit Summary.  MyChart is used to connect with patients for Virtual Visits (Telemedicine).  Patients are able to view lab/test results, encounter notes, upcoming appointments, etc.  Non-urgent messages can be sent to your provider as well.   To learn more about what you can do with MyChart, go to [email or web address].    Your next appointment:   4 week(s)  The format for your next appointment:    In Person  Provider:   Norman Herrlich, MD   Other Instructions

## 2020-12-13 ENCOUNTER — Telehealth: Payer: Self-pay

## 2020-12-13 NOTE — Telephone Encounter (Signed)
Spoke with patient regarding results and recommendation.  Patient verbalizes understanding and is agreeable to plan of care. Advised patient to call back with any issues or concerns.  

## 2020-12-13 NOTE — Telephone Encounter (Signed)
-----   Message from Baldo Daub, MD sent at 12/13/2020  3:46 PM EST ----- Overall the monitor is good the episodes where she notices her heart or her own heart rhythm she did have one episode of a brief run of atrial premature beats and I think taking that beta-blocker metoprolol as needed is a good idea.

## 2020-12-17 ENCOUNTER — Ambulatory Visit: Payer: BC Managed Care – PPO | Admitting: Cardiology

## 2020-12-17 ENCOUNTER — Other Ambulatory Visit: Payer: Self-pay

## 2020-12-17 ENCOUNTER — Encounter: Payer: Self-pay | Admitting: Cardiology

## 2020-12-17 VITALS — BP 114/70 | HR 86 | Ht 67.0 in | Wt 219.1 lb

## 2020-12-17 DIAGNOSIS — J45909 Unspecified asthma, uncomplicated: Secondary | ICD-10-CM | POA: Diagnosis not present

## 2020-12-17 DIAGNOSIS — I471 Supraventricular tachycardia: Secondary | ICD-10-CM

## 2020-12-17 MED ORDER — METOPROLOL SUCCINATE ER 25 MG PO TB24: 12.5000 mg | ORAL_TABLET | Freq: Every day | ORAL | 3 refills | Status: DC | PRN

## 2020-12-17 NOTE — Patient Instructions (Signed)

## 2020-12-17 NOTE — Progress Notes (Signed)
Cardiology Office Note:    Date:  12/17/2020   ID:  Dana Diaz, DOB 01/05/1961, MRN [id number]  PCP:  Marva Panda, NP  Cardiologist:  Norman Herrlich, MD    Referring MD: Marva Panda, NP    ASSESSMENT:    1. PAT (paroxysmal atrial tachycardia) (HCC)   2. Asthma, unspecified asthma severity, unspecified whether complicated, unspecified whether persistent    PLAN:    In order of problems listed above:  1. Continue low-dose beta-blocker Toprol-XL 12 and half milligrams daily.  Renew her prescription 2. Stable asthma   Next appointment: 1 year   Medication Adjustments/Labs and Tests Ordered: Current medicines are reviewed at length with the patient today.  Concerns regarding medicines are outlined above.  No orders of the defined types were placed in this encounter.  Meds ordered this encounter  Medications  . metoprolol succinate (TOPROL XL) 25 MG 24 hr tablet    Sig: Take 0.5 tablets (12.5 mg total) by mouth daily as needed (As needed for palpiations).    Dispense:  90 tablet    Refill:  3    No chief complaint on file.   History of Present Illness:    Dana Diaz is a 60 y.o. female with a hx of asthma palpitations with PVCs last seen 11/16/2020 showed treadmill stress test performed August 2021 which was normal. Compliance with diet, lifestyle and medications: Yes  She is markedly improved taking a low-dose of Toprol-XL and is taking 12 and half milligrams daily.  Although she did not note symptoms she had brief episodes of atrial tachycardia on the monitor and I think clinically that is her problem.  She prefers to take the low-dose beta-blocker daily which is done a good job for rapid heartbeat will continue the same I will plan to see back in the office in 1 year.  Following her last visit she utilized an event monitor for 14 days and showed rare ventricular ectopy and rare supraventricular ectopy.  Her triggered and diary events were all sinus  rhythm and sinus tachycardia.  Performed in January 2020 similar findings with one 10 beat run of PVCs Past Medical History:  Diagnosis Date  . Asthma     Past Surgical History:  Procedure Laterality Date  . EXTERNAL EAR SURGERY    . FOOT SURGERY    . HAND SURGERY      Current Medications: Current Meds  Medication Sig  . albuterol (PROVENTIL HFA;VENTOLIN HFA) 108 (90 Base) MCG/ACT inhaler Inhale into the lungs every 6 (six) hours as needed for wheezing or shortness of breath.  Marland Kitchen albuterol (PROVENTIL) (2.5 MG/3ML) 0.083% nebulizer solution Take 2.5 mg by nebulization every 6 (six) hours as needed for wheezing or shortness of breath.   . dexlansoprazole (DEXILANT) 60 MG capsule Take 60 mg by mouth daily as needed.  Marland Kitchen estradiol (ESTRACE) 0.1 MG/GM vaginal cream Place vaginally once a week.  . fluticasone (FLONASE) 50 MCG/ACT nasal spray Place 2 sprays into both nostrils daily.  . Olopatadine HCl 0.2 % SOLN Apply 1 drop to eye daily as needed.   . Vitamin D, Ergocalciferol, (DRISDOL) 1.25 MG (50000 UNIT) CAPS capsule Take 50,000 Units by mouth once a week.  Marland Kitchen XIIDRA 5 % SOLN Place into both eyes daily.  . [DISCONTINUED] metoprolol succinate (TOPROL XL) 25 MG 24 hr tablet Take 0.5 tablets (12.5 mg total) by mouth daily as needed (As needed for palpiations).     Allergies:   Patient has no known  allergies.   Social History   Socioeconomic History  . Marital status: Single    Spouse name: Not on file  . Number of children: Not on file  . Years of education: Not on file  . Highest education level: Not on file  Occupational History  . Not on file  Tobacco Use  . Smoking status: Former Smoker    Packs/day: 1.00    Years: 1.50    Pack years: 1.50  . Smokeless tobacco: Never Used  Vaping Use  . Vaping Use: Never used  Substance and Sexual Activity  . Alcohol use: Yes    Comment: occasionally  . Drug use: No  . Sexual activity: Not on file  Other Topics Concern  . Not on file   Social History Narrative  . Not on file   Social Determinants of Health   Financial Resource Strain: Not on file  Food Insecurity: Not on file  Transportation Needs: Not on file  Physical Activity: Not on file  Stress: Not on file  Social Connections: Not on file     Family History: The patient's family history includes Breast cancer in her maternal aunt; CAD in her father; Diabetes in her brother and maternal grandmother; Irregular heart beat in her father and mother; Prostate cancer in her father; Stroke in her maternal aunt and maternal uncle. ROS:   Please see the history of present illness.    All other systems reviewed and are negative.  EKGs/Labs/Other Studies Reviewed:    The following studies were reviewed today:  Recent Labs: 06/25/2020: NT-Pro BNP 67  Recent Lipid Panel No results found for: CHOL, TRIG, HDL, CHOLHDL, VLDL, LDLCALC, LDLDIRECT  Physical Exam:    VS:  BP 114/70   Pulse 86   Ht 5\' 7"  (1.702 m)   Wt 219 lb 1.9 oz (99.4 kg)   LMP 07/28/2018 (Approximate)   SpO2 97%   BMI 34.32 kg/m     Wt Readings from Last 3 Encounters:  12/17/20 219 lb 1.9 oz (99.4 kg)  11/16/20 218 lb (98.9 kg)  05/26/20 212 lb 12.8 oz (96.5 kg)     GEN:  Well nourished, well developed in no acute distress HEENT: Normal NECK: No JVD; No carotid bruits LYMPHATICS: No lymphadenopathy CARDIAC: RRR, no murmurs, rubs, gallops RESPIRATORY:  Clear to auscultation without rales, wheezing or rhonchi  ABDOMEN: Soft, non-tender, non-distended MUSCULOSKELETAL:  No edema; No deformity  SKIN: Warm and dry NEUROLOGIC:  Alert and oriented x 3 PSYCHIATRIC:  Normal affect    Signed, 05/28/20, MD  12/17/2020 2:36 PM    [facility]

## 2021-02-17 ENCOUNTER — Other Ambulatory Visit: Payer: Self-pay | Admitting: Obstetrics and Gynecology

## 2021-02-25 ENCOUNTER — Other Ambulatory Visit: Payer: Self-pay | Admitting: *Deleted

## 2021-02-25 DIAGNOSIS — N644 Mastodynia: Secondary | ICD-10-CM

## 2021-03-01 ENCOUNTER — Other Ambulatory Visit: Payer: Self-pay | Admitting: Obstetrics and Gynecology

## 2021-03-01 DIAGNOSIS — N950 Postmenopausal bleeding: Secondary | ICD-10-CM

## 2021-03-09 ENCOUNTER — Ambulatory Visit
Admission: RE | Admit: 2021-03-09 | Discharge: 2021-03-09 | Disposition: A | Discharge status: Home / Self Care | Payer: BC Managed Care – PPO | Source: Ambulatory Visit | Attending: Obstetrics and Gynecology | Admitting: Obstetrics and Gynecology

## 2021-03-09 DIAGNOSIS — N950 Postmenopausal bleeding: Secondary | ICD-10-CM

## 2021-03-17 ENCOUNTER — Ambulatory Visit: Payer: BC Managed Care – PPO

## 2021-03-17 ENCOUNTER — Ambulatory Visit
Admission: RE | Admit: 2021-03-17 | Discharge: 2021-03-17 | Disposition: A | Discharge status: Home / Self Care | Payer: BC Managed Care – PPO | Source: Ambulatory Visit | Attending: *Deleted | Admitting: *Deleted

## 2021-03-17 ENCOUNTER — Other Ambulatory Visit: Payer: Self-pay | Admitting: *Deleted

## 2021-03-17 ENCOUNTER — Other Ambulatory Visit: Payer: Self-pay

## 2021-03-17 DIAGNOSIS — Z1231 Encounter for screening mammogram for malignant neoplasm of breast: Secondary | ICD-10-CM

## 2021-03-17 DIAGNOSIS — N644 Mastodynia: Secondary | ICD-10-CM

## 2021-05-11 ENCOUNTER — Other Ambulatory Visit: Payer: Self-pay

## 2021-05-11 ENCOUNTER — Ambulatory Visit
Admission: RE | Admit: 2021-05-11 | Discharge: 2021-05-11 | Disposition: A | Discharge status: Home / Self Care | Payer: BC Managed Care – PPO | Source: Ambulatory Visit | Attending: *Deleted | Admitting: *Deleted

## 2021-05-11 DIAGNOSIS — Z1231 Encounter for screening mammogram for malignant neoplasm of breast: Secondary | ICD-10-CM

## 2021-07-03 IMAGING — CT CT CARDIAC CORONARY ARTERY CALCIUM SCORE
3 series · 14 of 20 positions shown, 15 images · non-contrast
Comparison: None.
COMPARISON: None.

Addendum:
EXAM:
OVER-READ INTERPRETATION  CT CHEST

The following report is an over-read performed by radiologist Dr.
Samo Ja Ancona [[email or web address]] on 06/15/2020. This over-read
does not include interpretation of cardiac or coronary anatomy or
pathology. The coronary calcium score interpretation by the
cardiologist is attached.
CLINICAL DATA: Risk stratification
Coronary Calcium Score
TECHNIQUE: The patient was scanned on a Siemens Force scanner. Axial
non-contrast 3 mm slices were carried out through the heart. The
data set was analyzed on a dedicated work station and scored using
the Agatson method.

[Series 2: casc 3.0 bv41 2 bestdiast 74 % · axial · 0.36mm/px · z∈[-212,-149]mm · 4 of 37 slices shown, 5 images]
[im 8/37  vessel]
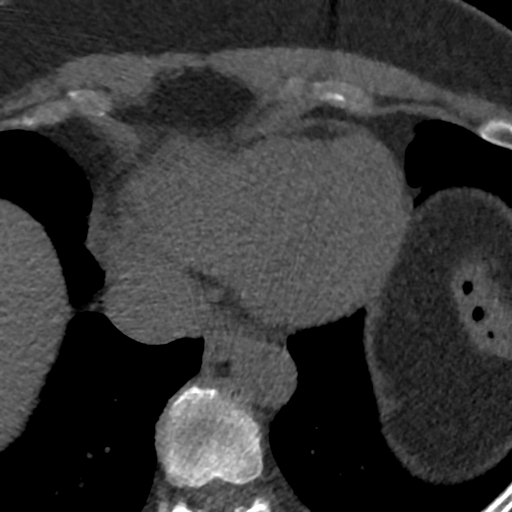
[im 8/37  lung]
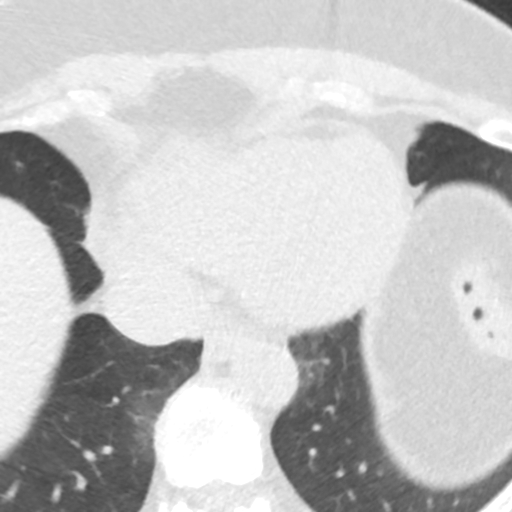
[im 15/37  vessel]
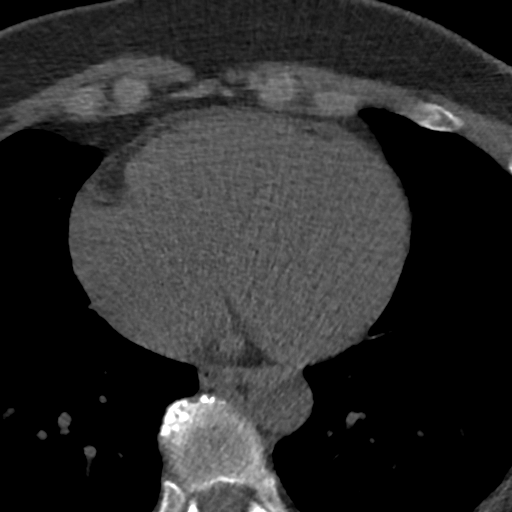
[im 22/37  vessel]
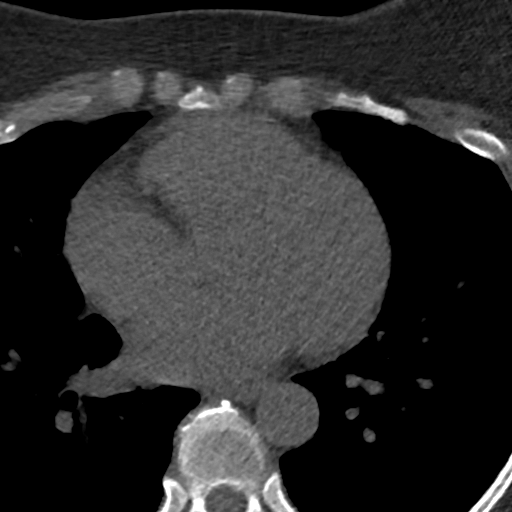
[im 29/37  vessel]
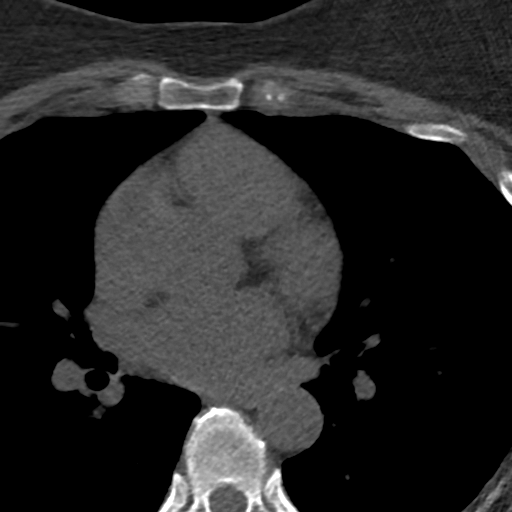

[Series 3: lung 75 % · axial · 0.63mm/px · z∈[-214,-142]mm · 5 of 37 slices shown]
[im 7/37  lung]
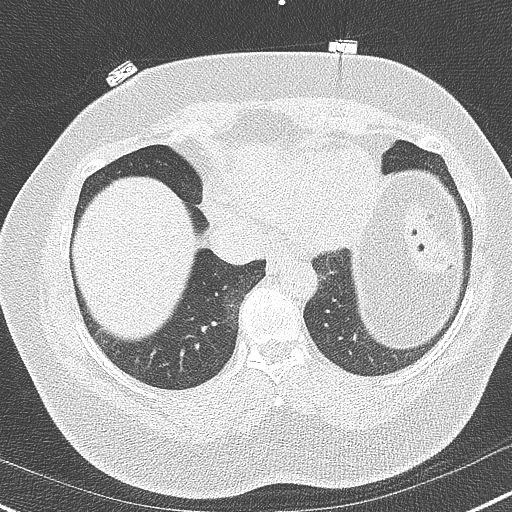
[im 13/37  lung]
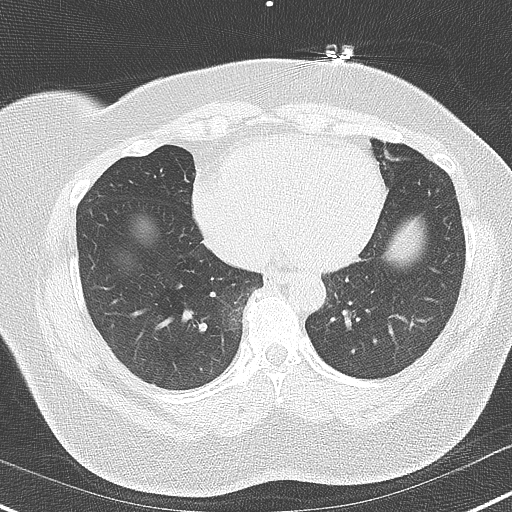
[im 19/37  lung]
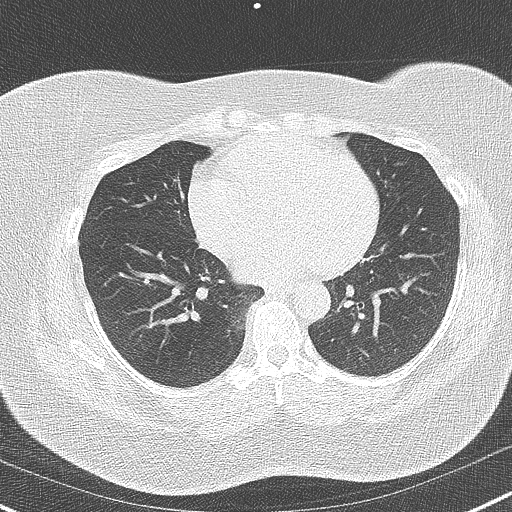
[im 25/37  lung]
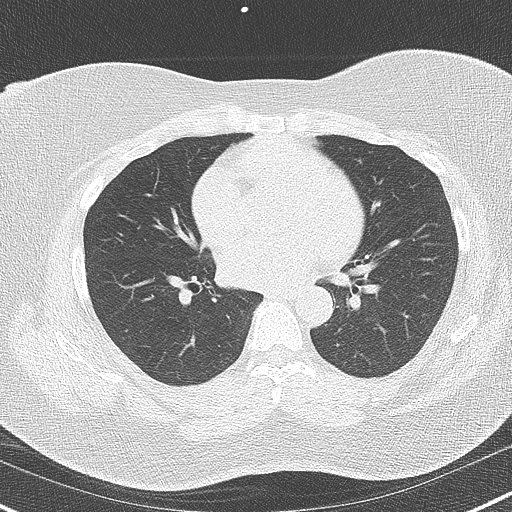
[im 31/37  lung]
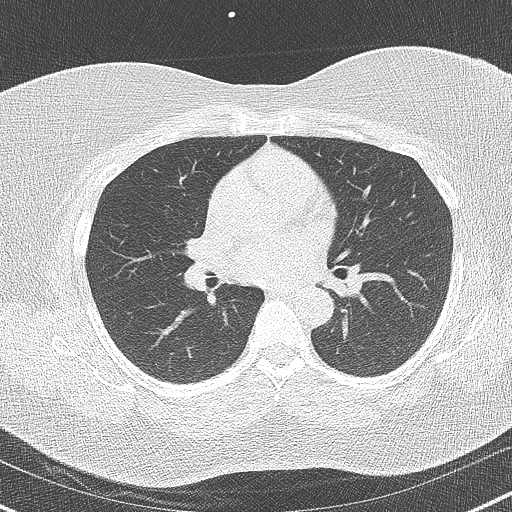

[Series 4: lung st 75 % · axial · 0.63mm/px · z∈[-214,-142]mm · 5 of 37 slices shown]
[im 7/37  lung]
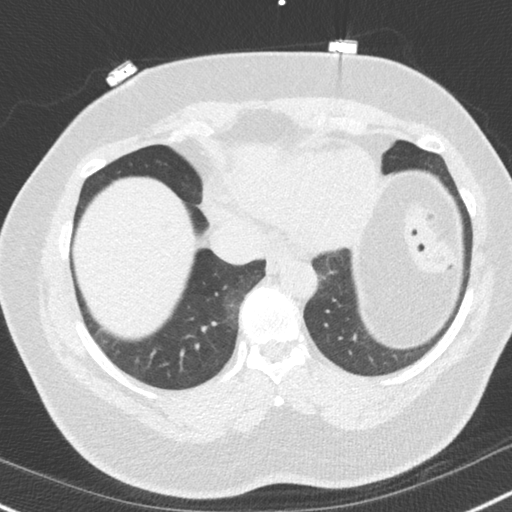
[im 13/37  lung]
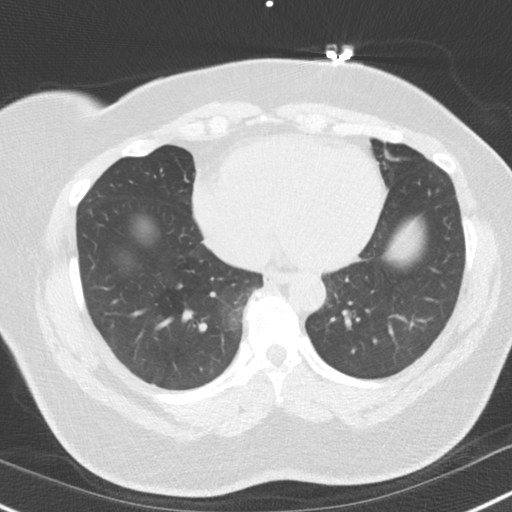
[im 19/37  lung]
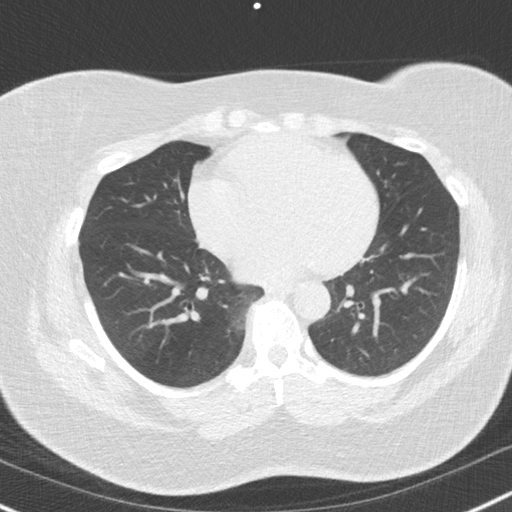
[im 25/37  lung]
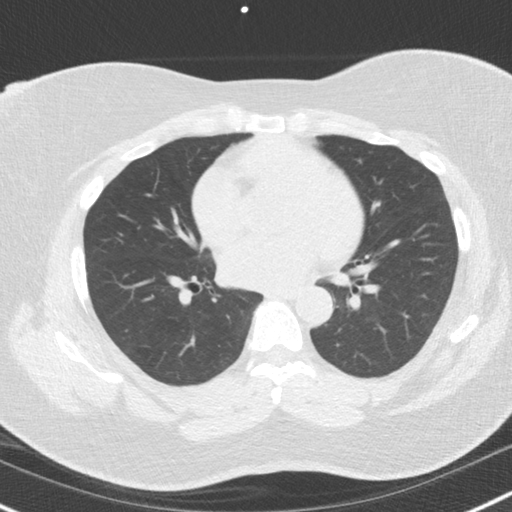
[im 31/37  lung]
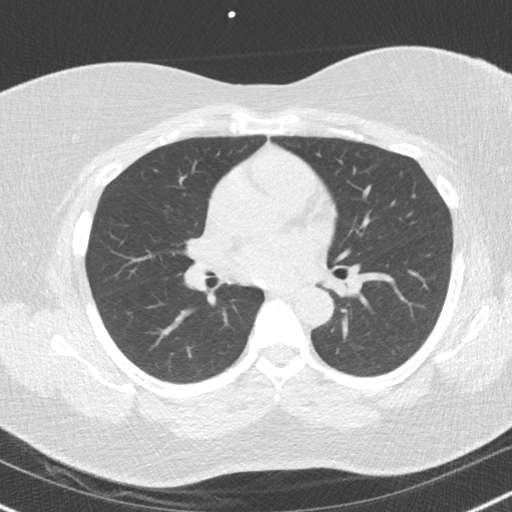

[14 of 20 positions shown; findings below may reference images not displayed]

FINDINGS: Vascular: Heart is normal size. Aorta normal caliber. Scattered
calcifications in the descending thoracic aorta.

Mediastinum/Nodes: No adenopathy.

Lungs/Pleura: Visualized lungs clear.  No effusions.

Upper Abdomen: No acute findings.

Musculoskeletal: Chest wall soft tissues are unremarkable. No acute
bony abnormality.
IMPRESSION: No acute extra cardiac abnormality.

Scattered descending aortic atherosclerosis.
FINDINGS: Non-cardiac: See separate report from [REDACTED].

Ascending Aorta: Normal caliber. Scattered calcifications in the
descending thoracic aorta.

Pericardium: Normal

Coronary arteries: Normal coronary origins.
IMPRESSION: Coronary calcium score of 0. This was 0 percentile for age and sex
matched control.

Mixell Egoavil

*** End of Addendum ***
EXAM:
OVER-READ INTERPRETATION  CT CHEST

The following report is an over-read performed by radiologist Dr.
Samo Ja Ancona [REDACTED] on 06/15/2020. This over-read
does not include interpretation of cardiac or coronary anatomy or
pathology. The coronary calcium score interpretation by the
cardiologist is attached.
FINDINGS: Vascular: Heart is normal size. Aorta normal caliber. Scattered
calcifications in the descending thoracic aorta.

Mediastinum/Nodes: No adenopathy.

Lungs/Pleura: Visualized lungs clear.  No effusions.

Upper Abdomen: No acute findings.

Musculoskeletal: Chest wall soft tissues are unremarkable. No acute
bony abnormality.
IMPRESSION: No acute extra cardiac abnormality.

Scattered descending aortic atherosclerosis.

## 2021-08-18 ENCOUNTER — Other Ambulatory Visit (HOSPITAL_BASED_OUTPATIENT_CLINIC_OR_DEPARTMENT_OTHER): Payer: Self-pay

## 2021-08-18 ENCOUNTER — Ambulatory Visit: Payer: BC Managed Care – PPO | Attending: Internal Medicine

## 2021-08-18 DIAGNOSIS — Z23 Encounter for immunization: Secondary | ICD-10-CM

## 2021-08-18 MED ORDER — MODERNA COVID-19 BIVAL BOOSTER 50 MCG/0.5ML IM SUSP
INTRAMUSCULAR | 0 refills | Status: DC
  Filled 2021-08-18: qty 0.5, 1d supply, fill #0

## 2021-08-18 NOTE — Progress Notes (Signed)
   Covid-19 Vaccination Clinic  Name:  CARLEN REBUCK    MRN: [id number] DOB: [birth year]  08/18/2021  Ms. Kreutzer was observed post Covid-19 immunization for 15 minutes without incident. She was provided with Vaccine Information Sheet and instruction to access the V-Safe system.   Ms. Milford was instructed to call 911 with any severe reactions post vaccine: Difficulty breathing  Swelling of face and throat  A fast heartbeat  A bad rash all over body  Dizziness and weakness

## 2021-10-27 ENCOUNTER — Other Ambulatory Visit: Payer: Self-pay | Admitting: Surgery

## 2021-10-27 DIAGNOSIS — M7989 Other specified soft tissue disorders: Secondary | ICD-10-CM

## 2021-11-29 ENCOUNTER — Other Ambulatory Visit: Payer: Self-pay

## 2021-11-29 ENCOUNTER — Ambulatory Visit
Admission: RE | Admit: 2021-11-29 | Discharge: 2021-11-29 | Disposition: A | Discharge status: Home / Self Care | Payer: BC Managed Care – PPO | Source: Ambulatory Visit | Attending: Surgery | Admitting: Surgery

## 2021-11-29 DIAGNOSIS — M7989 Other specified soft tissue disorders: Secondary | ICD-10-CM

## 2021-12-07 ENCOUNTER — Inpatient Hospital Stay: Admission: RE | Admit: 2021-12-07 | Payer: BC Managed Care – PPO | Source: Ambulatory Visit

## 2021-12-16 ENCOUNTER — Inpatient Hospital Stay: Admission: RE | Admit: 2021-12-16 | Payer: BC Managed Care – PPO | Source: Ambulatory Visit

## 2022-01-20 ENCOUNTER — Other Ambulatory Visit: Payer: Self-pay | Admitting: *Deleted

## 2022-01-20 DIAGNOSIS — Z1231 Encounter for screening mammogram for malignant neoplasm of breast: Secondary | ICD-10-CM

## 2022-01-23 ENCOUNTER — Other Ambulatory Visit: Payer: Self-pay

## 2022-01-23 MED ORDER — METOPROLOL SUCCINATE ER 25 MG PO TB24: 12.5000 mg | ORAL_TABLET | Freq: Every day | ORAL | 0 refills | Status: DC | PRN

## 2022-02-16 DIAGNOSIS — L6681 Central centrifugal cicatricial alopecia: Secondary | ICD-10-CM

## 2022-02-16 DIAGNOSIS — L669 Cicatricial alopecia, unspecified: Secondary | ICD-10-CM | POA: Insufficient documentation

## 2022-02-16 DIAGNOSIS — L089 Local infection of the skin and subcutaneous tissue, unspecified: Secondary | ICD-10-CM | POA: Insufficient documentation

## 2022-02-16 HISTORY — DX: Central centrifugal cicatricial alopecia: L66.81

## 2022-03-16 NOTE — Progress Notes (Deleted)
Cardiology Office Note:    Date:  03/16/2022   ID:  Dana Diaz, DOB [birth year], MRN [id number]  PCP:  Everardo Beals, NP  Cardiologist:  Shirlee More, MD    Referring MD: Everardo Beals, NP    ASSESSMENT:    No diagnosis found. PLAN:    In order of problems listed above:  ***   Next appointment: ***   Medication Adjustments/Labs and Tests Ordered: Current medicines are reviewed at length with the patient today.  Concerns regarding medicines are outlined above.  No orders of the defined types were placed in this encounter.  No orders of the defined types were placed in this encounter.   No chief complaint on file.   History of Present Illness:    Dana Diaz is a 61 y.o. female with a hx of paroxysmal atrial tachycardia PVCs and asthma last seen 12/17/2020. Compliance with diet, lifestyle and medications: *** Past Medical History:  Diagnosis Date   Asthma     Past Surgical History:  Procedure Laterality Date   EXTERNAL EAR SURGERY     FOOT SURGERY     HAND SURGERY      Current Medications: No outpatient medications have been marked as taking for the 03/17/22 encounter (Appointment) with Richardo Priest, MD.     Allergies:   Patient has no known allergies.   Social History   Socioeconomic History   Marital status: Single    Spouse name: Not on file   Number of children: Not on file   Years of education: Not on file   Highest education level: Not on file  Occupational History   Not on file  Tobacco Use   Smoking status: Former    Packs/day: 1.00    Years: 1.50    Pack years: 1.50    Types: Cigarettes   Smokeless tobacco: Never  Vaping Use   Vaping Use: Never used  Substance and Sexual Activity   Alcohol use: Yes    Comment: occasionally   Drug use: No   Sexual activity: Not on file  Other Topics Concern   Not on file  Social History Narrative   Not on file   Social Determinants of Health   Financial Resource Strain: Not on  file  Food Insecurity: Not on file  Transportation Needs: Not on file  Physical Activity: Not on file  Stress: Not on file  Social Connections: Not on file     Family History: The patient's ***family history includes Breast cancer in her maternal aunt; CAD in her father; Diabetes in her brother and maternal grandmother; Irregular heart beat in her father and mother; Prostate cancer in her father; Stroke in her maternal aunt and maternal uncle. ROS:   Please see the history of present illness.    All other systems reviewed and are negative.  EKGs/Labs/Other Studies Reviewed:    The following studies were reviewed today:  EKG:  EKG ordered today and personally reviewed.  The ekg ordered today demonstrates ***  Recent Labs: No results found for requested labs within last 8760 hours.  Recent Lipid Panel No results found for: CHOL, TRIG, HDL, CHOLHDL, VLDL, LDLCALC, LDLDIRECT  Physical Exam:    VS:  LMP 07/28/2018 (Approximate)     Wt Readings from Last 3 Encounters:  12/17/20 219 lb 1.9 oz (99.4 kg)  11/16/20 218 lb (98.9 kg)  05/26/20 212 lb 12.8 oz (96.5 kg)     GEN: *** Well nourished, well developed in no acute  distress HEENT: Normal NECK: No JVD; No carotid bruits LYMPHATICS: No lymphadenopathy CARDIAC: ***RRR, no murmurs, rubs, gallops RESPIRATORY:  Clear to auscultation without rales, wheezing or rhonchi  ABDOMEN: Soft, non-tender, non-distended MUSCULOSKELETAL:  No edema; No deformity  SKIN: Warm and dry NEUROLOGIC:  Alert and oriented x 3 PSYCHIATRIC:  Normal affect    Signed, Shirlee More, MD  03/16/2022 8:06 PM    [place]

## 2022-03-17 ENCOUNTER — Ambulatory Visit: Payer: BC Managed Care – PPO | Admitting: Cardiology

## 2022-04-08 ENCOUNTER — Other Ambulatory Visit: Payer: Self-pay | Admitting: Cardiology

## 2022-05-12 ENCOUNTER — Ambulatory Visit
Admission: RE | Admit: 2022-05-12 | Discharge: 2022-05-12 | Disposition: A | Discharge status: Home / Self Care | Payer: BC Managed Care – PPO | Source: Ambulatory Visit | Attending: *Deleted | Admitting: *Deleted

## 2022-05-12 DIAGNOSIS — Z1231 Encounter for screening mammogram for malignant neoplasm of breast: Secondary | ICD-10-CM

## 2022-06-07 ENCOUNTER — Ambulatory Visit: Payer: BC Managed Care – PPO | Admitting: Cardiology

## 2022-06-20 ENCOUNTER — Other Ambulatory Visit: Payer: Self-pay

## 2022-06-21 ENCOUNTER — Ambulatory Visit: Payer: BC Managed Care – PPO | Admitting: Cardiology

## 2022-06-21 ENCOUNTER — Encounter: Payer: Self-pay | Admitting: Cardiology

## 2022-06-21 VITALS — BP 104/68 | HR 84 | Ht 67.6 in | Wt 218.1 lb

## 2022-06-21 DIAGNOSIS — I471 Supraventricular tachycardia: Secondary | ICD-10-CM | POA: Diagnosis not present

## 2022-06-21 DIAGNOSIS — R002 Palpitations: Secondary | ICD-10-CM

## 2022-06-21 DIAGNOSIS — I493 Ventricular premature depolarization: Secondary | ICD-10-CM

## 2022-06-21 MED ORDER — METOPROLOL SUCCINATE ER 25 MG PO TB24: 12.5000 mg | ORAL_TABLET | Freq: Every day | ORAL | 3 refills | Status: DC

## 2022-06-21 NOTE — Progress Notes (Signed)
Cardiology Office Note:    Date:  06/21/2022   ID:  Dana Diaz, DOB [birth year], MRN [id number]  PCP:  Marva Panda, NP  Cardiologist:  Norman Herrlich, MD    Referring MD: Marva Panda, NP    ASSESSMENT:    1. Palpitations   2. PAT (paroxysmal atrial tachycardia) (HCC)   3. PVC's (premature ventricular contractions)    PLAN:    In order of problems listed above:  With flare of palpitation she will restart her beta-blocker which is previously given her good control.  The dose is so small I do not think it play a role with fatigue or hair thinning.   Next appointment: 1 year   Medication Adjustments/Labs and Tests Ordered: Current medicines are reviewed at length with the patient today.  Concerns regarding medicines are outlined above.  Orders Placed This Encounter  Procedures   EKG 12-Lead   Meds ordered this encounter  Medications   metoprolol succinate (TOPROL XL) 25 MG 24 hr tablet    Sig: Take 0.5 tablets (12.5 mg total) by mouth daily.    Dispense:  45 tablet    Refill:  3    Chief Complaint  Patient presents with   Follow-up   For arrhythmia History of Present Illness:    Dana Diaz is a 61 y.o. female with a hx of  asthma palpitations with PVCs and PAT  last seen 12/17/2020. Compliance with diet, lifestyle and medications: Yes  She ran out of her beta-blocker she is having more awareness of her heart beating although she uses a Fitbit and has had no alerts for atrial fibrillation or high rate activity Unfortunately she has a lot of exercise intolerance and is limited by plantar fasciitis. 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   albuterol (PROVENTIL) (2.5 MG/3ML) 0.083% nebulizer solution Take 2.5 mg by nebulization every 6 (six) hours as needed for wheezing or shortness of breath.    dexlansoprazole (DEXILANT) 60 MG capsule Take 60 mg by mouth daily as needed for heartburn.   estradiol (ESTRACE) 0.1 MG/GM vaginal cream Place 1 Applicatorful vaginally once a week.   fluticasone (FLONASE) 50 MCG/ACT nasal spray Place 2 sprays into both nostrils daily.   IMVEXXY MAINTENANCE PACK 4 MCG INST Place 4 mcg vaginally 2 (two) times a week.   metoprolol succinate (TOPROL XL) 25 MG 24 hr tablet Take 0.5 tablets (12.5 mg total) by mouth daily.   minoxidil (LONITEN) 2.5 MG tablet Take 2.5 mg by mouth every other day.   Olopatadine HCl 0.2 % SOLN Apply 1 drop to eye daily as needed for itching.   Vitamin D, Ergocalciferol, (DRISDOL) 1.25 MG (50000 UNIT) CAPS capsule Take 50,000 Units by mouth once a week.   XIIDRA 5 % SOLN Place 1 drop into both eyes daily.     Allergies:   Dust mite extract   Social History   Socioeconomic History   Marital status: Single    Spouse name:  Not on file   Number of children: Not on file   Years of education: Not on file   Highest education level: Not on file  Occupational History   Not on file  Tobacco Use   Smoking status: Former    Packs/day: 1.00    Years: 1.50    Total pack years: 1.50    Types: Cigarettes   Smokeless tobacco: Never  Vaping Use   Vaping Use: Never used  Substance and Sexual Activity   Alcohol use: Yes    Comment: occasionally   Drug use: No   Sexual activity: Not on file  Other Topics Concern   Not on file  Social History Narrative   Not on file   Social Determinants of Health   Financial Resource Strain: Not on file  Food Insecurity: Not on file  Transportation Needs: Not on file  Physical  Activity: Not on file  Stress: Not on file  Social Connections: Not on file     Family History: The patient's family history includes Breast cancer in her maternal aunt; CAD in her father; Diabetes in her brother and maternal grandmother; Irregular heart beat in her father and mother; Prostate cancer in her father; Stroke in her maternal aunt and maternal uncle. ROS:   Please see the history of present illness.    All other systems reviewed and are negative.  EKGs/Labs/Other Studies Reviewed:    The following studies were reviewed today:  EKG:  EKG ordered today and personally reviewed.  The ekg ordered today demonstrates sinus rhythm poor R wave progression  Recent Labs: Recent labs done at her PCP office I cannot see them in Care Everywhere  Physical Exam:    VS:  BP 104/68   Pulse 84   Ht 5' 7.6" (1.717 m)   Wt 218 lb 1.3 oz (98.9 kg)   LMP 07/28/2018 (Approximate)   SpO2 97%   BMI 33.55 kg/m     Wt Readings from Last 3 Encounters:  06/21/22 218 lb 1.3 oz (98.9 kg)  12/17/20 219 lb 1.9 oz (99.4 kg)  11/16/20 218 lb (98.9 kg)     GEN:  Well nourished, well developed in no acute distress HEENT: Normal NECK: No JVD; No carotid bruits LYMPHATICS: No lymphadenopathy CARDIAC: RRR, no murmurs, rubs, gallops RESPIRATORY:  Clear to auscultation without rales, wheezing or rhonchi  ABDOMEN: Soft, non-tender, non-distended MUSCULOSKELETAL:  No edema; No deformity  SKIN: Warm and dry NEUROLOGIC:  Alert and oriented x 3 PSYCHIATRIC:  Normal affect    Signed, Norman Herrlich, MD  06/21/2022 2:29 PM    [facility]

## 2022-06-21 NOTE — Patient Instructions (Signed)
Medication Instructions:  Your physician has recommended you make the following change in your medication:   START: Toprol XL 12,5 mg daily  *If you need a refill on your cardiac medications before your next appointment, please call your pharmacy*   Lab Work: None If you have labs (blood work) drawn today and your tests are completely normal, you will receive your results only by: MyChart Message (if you have MyChart) OR A paper copy in the mail If you have any lab test that is abnormal or we need to change your treatment, we will call you to review the results.   Testing/Procedures: None   Follow-Up: At [facility], you and your health needs are our priority.  As part of our continuing mission to provide you with exceptional heart care, we have created designated Provider Care Teams.  These Care Teams include your primary Cardiologist (physician) and Advanced Practice Providers (APPs -  Physician Assistants and Nurse Practitioners) who all work together to provide you with the care you need, when you need it.  We recommend signing up for the patient portal called "MyChart".  Sign up information is provided on this After Visit Summary.  MyChart is used to connect with patients for Virtual Visits (Telemedicine).  Patients are able to view lab/test results, encounter notes, upcoming appointments, etc.  Non-urgent messages can be sent to your provider as well.   To learn more about what you can do with MyChart, go to [email or web address].    Your next appointment:   1 year(s)  The format for your next appointment:   In Person  Provider:   Norman Herrlich, MD    Other Instructions None  Important Information About Sugar

## 2023-01-23 ENCOUNTER — Other Ambulatory Visit: Payer: Self-pay | Admitting: Obstetrics and Gynecology

## 2023-01-23 DIAGNOSIS — Z1231 Encounter for screening mammogram for malignant neoplasm of breast: Secondary | ICD-10-CM

## 2023-02-06 ENCOUNTER — Other Ambulatory Visit: Payer: Self-pay | Admitting: Obstetrics and Gynecology

## 2023-02-06 DIAGNOSIS — N6310 Unspecified lump in the right breast, unspecified quadrant: Secondary | ICD-10-CM

## 2023-02-15 ENCOUNTER — Ambulatory Visit
Admission: RE | Admit: 2023-02-15 | Discharge: 2023-02-15 | Disposition: A | Discharge status: Home / Self Care | Payer: BC Managed Care – PPO | Source: Ambulatory Visit | Attending: Obstetrics and Gynecology | Admitting: Obstetrics and Gynecology

## 2023-02-15 ENCOUNTER — Other Ambulatory Visit: Payer: Self-pay | Admitting: Obstetrics and Gynecology

## 2023-02-15 DIAGNOSIS — N6310 Unspecified lump in the right breast, unspecified quadrant: Secondary | ICD-10-CM

## 2023-03-29 ENCOUNTER — Ambulatory Visit
Admission: RE | Admit: 2023-03-29 | Discharge: 2023-03-29 | Disposition: A | Discharge status: Home / Self Care | Payer: BC Managed Care – PPO | Source: Ambulatory Visit | Attending: Obstetrics and Gynecology | Admitting: Obstetrics and Gynecology

## 2023-03-29 DIAGNOSIS — N6310 Unspecified lump in the right breast, unspecified quadrant: Secondary | ICD-10-CM

## 2023-04-05 ENCOUNTER — Encounter: Payer: Self-pay | Admitting: Cardiology

## 2023-04-05 NOTE — Progress Notes (Signed)
  Cardiology Office Note:   Date:  04/06/2023  ID:  Dana Diaz, DOB 10/07/1961, MRN [id number]  History of Present Illness:   Dana Diaz is a 62 y.o. female  with a hx of asthma and recent palpitations for which she seeks advice at the request of Marva Panda, NP.  The patient has seen Dr. Dulce Sellar in the past.  She then saw Dr. Jacinto Halim.  Most recently she saw Dr. Katrinka Blazing.  I see an echocardiogram in 2020 that demonstrated normal left-ventricular function with no significant valvular abnormalities.  She wore a monitor for 2 weeks at that time.  There was some rare PVCs and a couple of brief runs of ventricular ectopy.  She has had a negative calcium score of 0 in 2020.  She also had a treadmill that was negative for any evidence of ischemia.  She presents with multiple symptoms.  She feels her heart fluttering.  She feels this at night.  She wakes up with her heart fluttering.  She feels hot all the time.  She tried to mow the lawn last year and she could not do this.  She felt to fatigued.  She just states she did not feel like she could do it.  She has a pinging  chest discomfort occasionally.  She does not however describe any substernal chest pressure, neck or arm discomfort.  The symptoms are not reproducible.  She cannot really quantify or qualify them.  She has had some pain in her right leg and calf up into her thigh.  She denies any recent syncope.  She has to sleep with her head up a little bit.  She is not describing any new PND or orthopnea.  She has had no new weight gain or edema.  ROS: As stated in the HPI and negative for all other systems.  Studies Reviewed:    EKG:  NSR, rate 84, axis within normal limits, intervals within normal limits, no acute ST-T wave changes.   Risk Assessment/Calculations:              Physical Exam:   VS:  BP 124/74   Pulse 95   Ht 5\' 7"  (1.702 m)   Wt 208 lb (94.3 kg)   LMP 07/28/2018 (Approximate)   SpO2 95%   BMI 32.58 kg/m    Wt Readings  from Last 3 Encounters:  04/06/23 208 lb (94.3 kg)  06/21/22 218 lb 1.3 oz (98.9 kg)  12/17/20 219 lb 1.9 oz (99.4 kg)     GEN: Well nourished, well developed in no acute distress NECK: No JVD; No carotid bruits CARDIAC: RRR, no murmurs, rubs, gallops RESPIRATORY:  Clear to auscultation without rales, wheezing or rhonchi  ABDOMEN: Soft, non-tender, non-distended EXTREMITIES:  No edema; No deformity   ASSESSMENT AND PLAN:   Palpitations: I am going to have her wear a 4-week event monitor.  Further evaluation will be based on these results.  I will check a TSH, CBC, basic metabolic profile and magnesium.  General: She needs a lipid profile.  Chest discomfort: She has some atypical chest discomfort as described above.  She also has an arrhythmia with previous 10 beat run of ventricular tachycardia noted several years ago on her monitor.  I will bring her back for a POET (Plain Old Exercise Treadmill) to evaluate the chest discomfort and just make sure we do not have any inducible arrhythmias.        Signed, Rollene Rotunda, MD

## 2023-04-05 NOTE — Progress Notes (Deleted)
Cardiology Office Note:   Date:  04/05/2023  ID:  Dana Diaz, DOB 12-26-1960, MRN [id number]  History of Present Illness:   Dana Diaz is a 62 y.o. female  with a hx of asthma and recent palpitations for which she seeks advice at the request of Dana Panda, NP.  The patient has seen Dr. Dulce Diaz in the past.  She then saw Dr. Jacinto Diaz.  Most recently she saw Dr. Katrinka Diaz.  ***   RESSIONS     1. The left ventricle has normal systolic function of 60-65%. The cavity  size was normal. Left ventricular diastolic Doppler parameters are  consistent with impaired relaxation.   2. The right ventricle has normal systolic function. The cavity was  mildly enlarged. There is no increase in right ventricular wall thickness.  Right ventricular systolic pressure is normal.   3. The mitral valve is normal in structure.   4. The tricuspid valve is normal in structure.   5. The aortic valve is tricuspid.   6. The pulmonic valve was normal in structure.    ZIO monitor was performed 13 days 7 hours initiating 11/26/2018 to assess palpitation.   The rhythm throughout is sinus with minimum average and maximum heart rates of 60, 92 and 162 bpm.   There is one triggered event sinus tachycardia rate of 136 bpm.   There were no pauses of 3 seconds or greater or episodes of AV node or sinus node block.   Although ventricular ectopy is rare with PVCs and couplet there is 110 beat run of PVCs right bundle branch morphology with fusion beats at the beginning and end in A-V dissociation.  Cycle length was 450 ms.   Supraventricular ectopy is rare without episodes of atrial fibrillation or flutter  Her concern has been palpitations, separate episodes of feeling dizzy as if blood is rushing to her head, and irregular heartbeats noted when she monitors her blood pressure even if she is not having palpitations.  The symptoms were evaluated by Dr. Dulce Diaz in 2019.  She had PVCs noted on a 7-day monitor with a 10 beat  run of wide-complex tachycardia that was not recognized as a symptom.  An echocardiogram was also performed and did not reveal any structural abnormality.  Physical activity such as climbing stairs causes the heart to race, she occasionally feels dizzy, and frequently has to stop and rest.  She has to walk 3 flight of stairs to get to her apartment.  This is increasingly becoming a struggle.    ROS: ***  Studies Reviewed:    EKG:  ***  ***  Risk Assessment/Calculations:   {Does this patient have ATRIAL FIBRILLATION?[phone]} No BP recorded.  {Refresh Note OR Click here to enter BP  :1}***        Physical Exam:   VS:  LMP 07/28/2018 (Approximate)    Wt Readings from Last 3 Encounters:  06/21/22 218 lb 1.3 oz (98.9 kg)  12/17/20 219 lb 1.9 oz (99.4 kg)  11/16/20 218 lb (98.9 kg)     GEN: Well nourished, well developed in no acute distress NECK: No JVD; No carotid bruits CARDIAC: ***RRR, no murmurs, rubs, gallops RESPIRATORY:  Clear to auscultation without rales, wheezing or rhonchi  ABDOMEN: Soft, non-tender, non-distended EXTREMITIES:  No edema; No deformity   ASSESSMENT AND PLAN:   Palpitations:  ***     {Are you ordering a CV Procedure (e.g. stress test, cath, DCCV, TEE, etc)?   Press F2        :  [id number]}   Signed, Rollene Rotunda, MD

## 2023-04-06 ENCOUNTER — Ambulatory Visit: Payer: BC Managed Care – PPO | Attending: Cardiology | Admitting: Cardiology

## 2023-04-06 ENCOUNTER — Encounter: Payer: Self-pay | Admitting: Cardiology

## 2023-04-06 ENCOUNTER — Ambulatory Visit: Payer: BC Managed Care – PPO | Admitting: Cardiology

## 2023-04-06 VITALS — BP 124/74 | HR 95 | Ht 67.0 in | Wt 208.0 lb

## 2023-04-06 DIAGNOSIS — R0609 Other forms of dyspnea: Secondary | ICD-10-CM | POA: Diagnosis not present

## 2023-04-06 DIAGNOSIS — R002 Palpitations: Secondary | ICD-10-CM | POA: Diagnosis not present

## 2023-04-06 NOTE — Patient Instructions (Signed)
Medication Instructions:  No Changes In Medications at this time.   *If you need a refill on your cardiac medications before your next appointment, please call your pharmacy*  Lab Work: BLOOD WORK TODAY  If you have labs (blood work) drawn today and your tests are completely normal, you will receive your results only by: MyChart Message (if you have MyChart) OR A paper copy in the mail If you have any lab test that is abnormal or we need to change your treatment, we will call you to review the results.  Testing/Procedures: Your physician has requested that you have an exercise tolerance test. For further information please visit [email or web address] Please also follow instruction sheet, as given.  Your physician has requested that you have an exercise tolerance test.  Please also follow instruction sheet, as given. This will take place at [address], [address] Do not drink or eat foods with caffeine for 24 hours before the test. (Chocolate, coffee, tea, or energy drinks) If you use an inhaler, bring it with you to the test. Do not smoke for 4 hours before the test. Wear comfortable shoes and clothing.  [employer] Cardiac Event Monitor Instructions  Your physician has requested you wear your cardiac event monitor for _30____ days, (1-30). [employer] may call or text to confirm a shipping address. The monitor will be sent to a land address via UPS. [employer] will not ship a monitor to a PO BOX. It typically takes 3-5 days to receive your monitor after it has been enrolled. [employer] will assist with USPS tracking if your package is delayed. The telephone number for [employer] is [phone]. Once you have received your monitor, please review the enclosed instructions. Instruction tutorials can also be viewed under help and settings on the enclosed cell phone. Your monitor has already been registered assigning a specific monitor serial # to you.  Billing and Self Pay Discount  Information  [employer] has been provided the insurance information we had on file for you.  If your insurance has been updated, please call [employer] at [phone] to provide them with your updated insurance information.   [employer] offers a discounted Self Pay option for patients who have insurance that does not cover their cardiac event monitor or patients without insurance.  The discounted cost of a Self Pay Cardiac Event Monitor would be $225.00 , if the patient contacts [employer] at [phone] within 7 days of applying the monitor to make payment arrangements.  If the patient does not contact [employer] within 7 days of applying the monitor, the cost of the cardiac event monitor will be $350.00.  Applying the monitor  Remove cell phone from case and turn it on. The cell phone works as IT consultant and needs to be within [employer] of you at all times. The cell phone will need to be charged on a daily basis. We recommend you plug the cell phone into the enclosed charger at your bedside table every night.  Monitor batteries: You will receive two monitor batteries labelled #1 and #2. These are your recorders. Plug battery #2 onto the second connection on the enclosed charger. Keep one battery on the charger at all times. This will keep the monitor battery deactivated. It will also keep it fully charged for when you need to switch your monitor batteries. A small light will be blinking on the battery emblem when it is charging. The light on the battery emblem will remain on when the battery is fully charged.  Open package  of a Monitor strip. Insert battery #1 into black hood on strip and gently squeeze monitor battery onto connection as indicated in instruction booklet. Set aside while preparing skin.  Choose location for your strip, vertical or horizontal, as indicated in the instruction booklet. Shave to remove all hair from location. There cannot be any lotions, oils, powders, or  colognes on skin where monitor is to be applied. Wipe skin clean with enclosed Saline wipe. Dry skin completely.  Peel paper labeled #1 off the back of the Monitor strip exposing the adhesive. Place the monitor on the chest in the vertical or horizontal position shown in the instruction booklet. One arrow on the monitor strip must be pointing upward. Carefully remove paper labeled #2, attaching remainder of strip to your skin. Try not to create any folds or wrinkles in the strip as you apply it.  Firmly press and release the circle in the center of the monitor battery. You will hear a small beep. This is turning the monitor battery on. The heart emblem on the monitor battery will light up every 5 seconds if the monitor battery in turned on and connected to the patient securely. Do not push and hold the circle down as this turns the monitor battery off. The cell phone will locate the monitor battery. A screen will appear on the cell phone checking the connection of your monitor strip. This may read poor connection initially but change to good connection within the next minute. Once your monitor accepts the connection you will hear a series of 3 beeps followed by a climbing crescendo of beeps. A screen will appear on the cell phone showing the two monitor strip placement options. Touch the picture that demonstrates where you applied the monitor strip.  Your monitor strip and battery are waterproof. You are able to shower, bathe, or swim with the monitor on. They just ask you do not submerge deeper than 3 feet underwater. We recommend removing the monitor if you are swimming in a lake, river, or ocean.  Your monitor battery will need to be switched to a fully charged monitor battery approximately once a week. The cell phone will alert you of an action which needs to be made.  On the cell phone, tap for details to reveal connection status, monitor battery status, and cell phone battery status.  The green dots indicates your monitor is in good status. A red dot indicates there is something that needs your attention.  To record a symptom, click the circle on the monitor battery. In 30-60 seconds a list of symptoms will appear on the cell phone. Select your symptom and tap save. Your monitor will record a sustained or significant arrhythmia regardless of you clicking the button. Some patients do not feel the heart rhythm irregularities. [employer] will notify us of any serious or critical events.  Refer to instruction booklet for instructions on switching batteries, changing strips, the Do not disturb or Pause features, or any additional questions.  Call [employer] at [phone], to confirm your monitor is transmitting and record your baseline. They will answer any questions you may have regarding the monitor instructions at that time.  Returning the monitor to [employer]  Place all equipment back into blue box. Peel off strip of paper to expose adhesive and close box securely. There is a prepaid UPS shipping label on this box. Drop in a UPS drop box, or at a UPS facility like [employer]. You may also contact [employer] to arrange UPS to pick  up monitor package at your home.  Follow-Up: At [facility], you and your health needs are our priority.  As part of our continuing mission to provide you with exceptional heart care, we have created designated Provider Care Teams.  These Care Teams include your primary Cardiologist (physician) and Advanced Practice Providers (APPs -  Physician Assistants and Nurse Practitioners) who all work together to provide you with the care you need, when you need it.  Your next appointment:   6 week(s)  Provider:   Rollene Rotunda, MD

## 2023-04-07 LAB — LIPID PANEL
Chol/HDL Ratio: 3.1 ratio (ref 0.0–4.4)
Cholesterol, Total: 188 mg/dL (ref 100–199)
HDL: 61 mg/dL (ref >39)
LDL Chol Calc (NIH): 111 mg/dL — ABNORMAL HIGH (ref 0–99)
Triglycerides: 88 mg/dL (ref 0–149)
VLDL Cholesterol Cal: 16 mg/dL (ref 5–40)

## 2023-04-07 LAB — CBC
Hematocrit: 35.4 % (ref 34.0–46.6)
Hemoglobin: 11.9 g/dL (ref 11.1–15.9)
MCH: 29.5 pg (ref 26.6–33.0)
MCHC: 33.6 g/dL (ref 31.5–35.7)
MCV: 88 fL (ref 79–97)
Platelets: 388 10*3/uL (ref 150–450)
RBC: 4.03 x10E6/uL (ref 3.77–5.28)
RDW: 13.3 % (ref 11.7–15.4)
WBC: 4.9 10*3/uL (ref 3.4–10.8)

## 2023-04-07 LAB — BASIC METABOLIC PANEL
BUN/Creatinine Ratio: 16 (ref 9–23)
BUN: 15 mg/dL (ref 6–24)
CO2: 24 mmol/L (ref 20–29)
Calcium: 9.8 mg/dL (ref 8.7–10.2)
Chloride: 104 mmol/L (ref 96–106)
Creatinine, Ser: 0.93 mg/dL (ref 0.57–1.00)
Glucose: 99 mg/dL (ref 70–99)
Potassium: 4.6 mmol/L (ref 3.5–5.2)
Sodium: 139 mmol/L (ref 134–144)
eGFR: 71 mL/min/{1.73_m2} (ref >59)

## 2023-04-07 LAB — MAGNESIUM: Magnesium: 2.1 mg/dL (ref 1.6–2.3)

## 2023-04-07 LAB — TSH: TSH: 2.17 u[IU]/mL (ref 0.450–4.500)

## 2023-04-10 ENCOUNTER — Encounter: Payer: Self-pay | Admitting: *Deleted

## 2023-04-18 ENCOUNTER — Ambulatory Visit: Payer: BC Managed Care – PPO | Attending: Cardiology

## 2023-04-18 DIAGNOSIS — R002 Palpitations: Secondary | ICD-10-CM

## 2023-04-18 NOTE — Progress Notes (Unsigned)
Patient enrolled to mail [employer] event 04/06/2023. Serial # [id number] applied in office 04/18/23.

## 2023-04-23 ENCOUNTER — Telehealth (HOSPITAL_COMMUNITY): Payer: Self-pay | Admitting: *Deleted

## 2023-04-23 NOTE — Telephone Encounter (Signed)
Patient given instructions for upcoming stress test.  Dana Diaz  

## 2023-04-25 ENCOUNTER — Ambulatory Visit (HOSPITAL_COMMUNITY): Payer: BC Managed Care – PPO | Attending: Cardiology

## 2023-04-25 DIAGNOSIS — R0609 Other forms of dyspnea: Secondary | ICD-10-CM

## 2023-04-25 DIAGNOSIS — R002 Palpitations: Secondary | ICD-10-CM | POA: Diagnosis present

## 2023-04-26 LAB — EXERCISE TOLERANCE TEST
Angina Index: 0
Base ST Depression (mm): 0 mm
Duke Treadmill Score: 2
Estimated workload: 7.7
Exercise duration (min): 6 min
Exercise duration (sec): 30 s
MPHR: 161 {beats}/min
Peak HR: 157 {beats}/min
Percent HR: 98 %
RPE: 17
Rest HR: 80 {beats}/min
ST Depression (mm): 1 mm

## 2023-05-02 ENCOUNTER — Encounter: Payer: Self-pay | Admitting: *Deleted

## 2023-05-11 ENCOUNTER — Ambulatory Visit: Payer: BC Managed Care – PPO | Admitting: Cardiology

## 2023-05-14 ENCOUNTER — Ambulatory Visit: Payer: BC Managed Care – PPO

## 2023-05-14 ENCOUNTER — Ambulatory Visit: Payer: BC Managed Care – PPO | Admitting: Cardiology

## 2023-05-15 ENCOUNTER — Ambulatory Visit
Admission: RE | Admit: 2023-05-15 | Discharge: 2023-05-15 | Disposition: A | Discharge status: Home / Self Care | Payer: BC Managed Care – PPO | Source: Ambulatory Visit | Attending: Obstetrics and Gynecology | Admitting: Obstetrics and Gynecology

## 2023-05-15 DIAGNOSIS — Z1231 Encounter for screening mammogram for malignant neoplasm of breast: Secondary | ICD-10-CM

## 2023-05-29 ENCOUNTER — Telehealth: Payer: Self-pay | Admitting: *Deleted

## 2023-05-29 NOTE — Telephone Encounter (Signed)
Spoke with pt, aware of monitor results. She does report lightheadedness, SOB and eye lids getting heavy with the runs of AT. She also reports the rhythm waking her up during the night. She would like something else besides metoprolol or beta blocker to help control the rhythm. She is current working with dermatology because of alopecia and feels the metoprolol is contributing to her problem. Aware dr Antoine Poche is out of the office so there will be a delay in me getting back to her. Patient voiced understanding and she has a follow up appointment 06/18/23.

## 2023-05-29 NOTE — Telephone Encounter (Signed)
-----   Message from Rollene Rotunda sent at 05/27/2023 12:10 PM EDT ----- She has some short runs of atrial tachycardia which are symptomatic.  I would suggest therapy only if the symptoms are bothering her.  In that case I would increase the beta blocker first. Call Dana Diaz with the results and send results to Elie Confer, NP

## 2023-05-29 NOTE — Telephone Encounter (Signed)
Spoke with pt, aware dr hochrein would prefer to wait to discuss at office visit 06/18/23. Patient voiced understanding.

## 2023-06-15 ENCOUNTER — Other Ambulatory Visit: Payer: Self-pay | Admitting: Cardiology

## 2023-06-17 DIAGNOSIS — R002 Palpitations: Secondary | ICD-10-CM | POA: Insufficient documentation

## 2023-06-17 NOTE — Progress Notes (Unsigned)
Cardiology Office Note:   Date:  06/18/2023  ID:  Dana Diaz, DOB [birth year], MRN [id number] PCP: Elie Confer, NP  [facility] Providers Cardiologist:  Rollene Rotunda, MD {  History of Present Illness:   Dana Diaz is a 62 y.o. female  who I saw previously for evaluation of lpitations for which she seeks advice at the request of Marva Panda, NP.  The patient has seen Dr. Dulce Sellar in the past.  She then saw Dr. Jacinto Halim.  Most recently she saw Dr. Katrinka Blazing.  An echocardiogram in 2020 demonstrated normal left-ventricular function with no significant valvular abnormalities.  She wore a monitor for 2 weeks at that time.  There was some rare PVCs and a couple of brief runs of ventricular ectopy.  She has had a negative calcium score of 0 in 2020.  She also had a treadmill that was negative for any evidence of ischemia.   After I saw her for the first time she wore a four week monitor and had short runs of atrial tach but no sustained arrhythmias. She had a negative POET (Plain Old Exercise Treadmill) again in June 2024.  She continues to have multiple symptoms including tingling in her hands.  She has some squeezing in her temples.  She gets heavy eyelids.  She feels like when she bends over she particularly exacerbates the symptoms.  She is still feeling palpitations.  I went back and looked at her monitor and could not really correlate a lot of her symptoms of dizziness and chest pain and fatigue with any particular arrhythmia.  She did not really notice some 1 time she had nonsustained ventricular tachycardia.  They may or may not correlate with SVT.      ROS: As stated in the HPI and negative for all other systems.  Studies Reviewed:    EKG:      Risk Assessment/Calculations:      Physical Exam:   VS:  BP 122/76 (BP Location: Left Arm, Patient Position: Sitting, Cuff Size: Normal)   Pulse 91   Ht 5\' 7"  (1.702 m)   Wt 216 lb (98 kg)   LMP 07/28/2018 (Approximate)   SpO2  96%   BMI 33.83 kg/m    Wt Readings from Last 3 Encounters:  06/18/23 216 lb (98 kg)  04/06/23 208 lb (94.3 kg)  06/21/22 218 lb 1.3 oz (98.9 kg)     GEN: Well nourished, well developed in no acute distress NECK: No JVD; No carotid bruits CARDIAC: RRR, no murmurs, rubs, gallops RESPIRATORY:  Clear to auscultation without rales, wheezing or rhonchi  ABDOMEN: Soft, non-tender, non-distended EXTREMITIES:  No edema; No deformity   ASSESSMENT AND PLAN:   Palpitations: She continues to have symptoms may be related to her atrial tachycardia which she has brief runs.  She had negative extensive workup.  Labs have been done to include a normal TSH.  At this point I would manage her symptomatically.  She would like to try switching from metoprolol to atenolol.  No other workup is suggested.    General: LDL was 111 with an HDL of 61 in May.  Given the absence of coronary calcium no further testing or change in therapy would be suggested.   Chest discomfort:   She had a negative POET (Plain Old Exercise Treadmill).  No further work up.  Her symptoms were non anginal.       Follow up me in about six months.   Signed, Rollene Rotunda,  MD

## 2023-06-18 ENCOUNTER — Encounter: Payer: Self-pay | Admitting: Cardiology

## 2023-06-18 ENCOUNTER — Ambulatory Visit: Payer: BC Managed Care – PPO | Attending: Cardiology | Admitting: Cardiology

## 2023-06-18 VITALS — BP 122/76 | HR 91 | Ht 67.0 in | Wt 216.0 lb

## 2023-06-18 DIAGNOSIS — R002 Palpitations: Secondary | ICD-10-CM | POA: Diagnosis not present

## 2023-06-18 MED ORDER — ATENOLOL 25 MG PO TABS: 25.0000 mg | ORAL_TABLET | Freq: Every day | ORAL | 3 refills | Status: DC

## 2023-06-18 NOTE — Patient Instructions (Signed)
Medication Instructions:  Your physician has recommended you make the following change in your medication:  -Stop metoprolol succinate (Toprol-XL).  -Start taking atenolol (tenormin) 25mg  once daily.  *If you need a refill on your cardiac medications before your next appointment, please call your pharmacy*   Follow-Up: At [facility], you and your health needs are our priority.  As part of our continuing mission to provide you with exceptional heart care, we have created designated Provider Care Teams.  These Care Teams include your primary Cardiologist (physician) and Advanced Practice Providers (APPs -  Physician Assistants and Nurse Practitioners) who all work together to provide you with the care you need, when you need it.  We recommend signing up for the patient portal called "MyChart".  Sign up information is provided on this After Visit Summary.  MyChart is used to connect with patients for Virtual Visits (Telemedicine).  Patients are able to view lab/test results, encounter notes, upcoming appointments, etc.  Non-urgent messages can be sent to your provider as well.   To learn more about what you can do with MyChart, go to [email or web address].    Your next appointment:   6 month(s)  Provider:   Rollene Rotunda, MD

## 2023-11-21 ENCOUNTER — Other Ambulatory Visit: Payer: Self-pay | Admitting: Family Medicine

## 2023-11-21 ENCOUNTER — Other Ambulatory Visit (HOSPITAL_COMMUNITY): Payer: Self-pay | Admitting: Family Medicine

## 2023-11-21 DIAGNOSIS — R519 Headache, unspecified: Secondary | ICD-10-CM

## 2023-11-21 DIAGNOSIS — R0602 Shortness of breath: Secondary | ICD-10-CM

## 2023-12-12 ENCOUNTER — Ambulatory Visit (HOSPITAL_COMMUNITY): Payer: 59 | Attending: Family Medicine

## 2023-12-12 DIAGNOSIS — R0602 Shortness of breath: Secondary | ICD-10-CM | POA: Insufficient documentation

## 2023-12-12 LAB — ECHOCARDIOGRAM COMPLETE
Area-P 1/2: 4.21 cm2
S' Lateral: 2.6 cm

## 2023-12-27 ENCOUNTER — Encounter: Payer: Self-pay | Admitting: Family Medicine

## 2024-01-04 ENCOUNTER — Other Ambulatory Visit: Payer: Self-pay | Admitting: Obstetrics and Gynecology

## 2024-01-04 DIAGNOSIS — Z1231 Encounter for screening mammogram for malignant neoplasm of breast: Secondary | ICD-10-CM

## 2024-01-07 ENCOUNTER — Other Ambulatory Visit: Payer: Self-pay

## 2024-02-04 ENCOUNTER — Ambulatory Visit: Payer: BC Managed Care – PPO | Admitting: Internal Medicine

## 2024-02-07 ENCOUNTER — Other Ambulatory Visit: Payer: Self-pay

## 2024-03-14 ENCOUNTER — Ambulatory Visit
Admission: RE | Admit: 2024-03-14 | Discharge: 2024-03-14 | Disposition: A | Discharge status: Home / Self Care | Payer: Self-pay | Source: Ambulatory Visit | Attending: Family Medicine | Admitting: Family Medicine

## 2024-03-14 DIAGNOSIS — R519 Headache, unspecified: Secondary | ICD-10-CM

## 2024-03-14 MED ORDER — GADOPICLENOL 0.5 MMOL/ML IV SOLN
10.0000 mL | Freq: Once | INTRAVENOUS | Status: AC | PRN
  Administered 2024-03-14: 10 mL via INTRAVENOUS

## 2024-05-15 ENCOUNTER — Ambulatory Visit
Admission: RE | Admit: 2024-05-15 | Discharge: 2024-05-15 | Disposition: A | Discharge status: Home / Self Care | Payer: 59 | Source: Ambulatory Visit | Attending: Obstetrics and Gynecology | Admitting: Obstetrics and Gynecology

## 2024-05-15 DIAGNOSIS — Z1231 Encounter for screening mammogram for malignant neoplasm of breast: Secondary | ICD-10-CM

## 2024-05-21 ENCOUNTER — Other Ambulatory Visit: Payer: Self-pay | Admitting: Obstetrics and Gynecology

## 2024-05-21 DIAGNOSIS — N6489 Other specified disorders of breast: Secondary | ICD-10-CM

## 2024-05-27 ENCOUNTER — Ambulatory Visit
Admission: RE | Admit: 2024-05-27 | Discharge: 2024-05-27 | Disposition: A | Discharge status: Home / Self Care | Source: Ambulatory Visit | Attending: Obstetrics and Gynecology | Admitting: Obstetrics and Gynecology

## 2024-05-27 ENCOUNTER — Other Ambulatory Visit: Payer: Self-pay | Admitting: Obstetrics and Gynecology

## 2024-05-27 DIAGNOSIS — N6489 Other specified disorders of breast: Secondary | ICD-10-CM

## 2024-05-27 DIAGNOSIS — N6310 Unspecified lump in the right breast, unspecified quadrant: Secondary | ICD-10-CM

## 2024-06-02 ENCOUNTER — Other Ambulatory Visit

## 2024-06-04 ENCOUNTER — Other Ambulatory Visit

## 2024-06-05 ENCOUNTER — Ambulatory Visit
Admission: RE | Admit: 2024-06-05 | Discharge: 2024-06-05 | Disposition: A | Discharge status: Home / Self Care | Source: Ambulatory Visit | Attending: Obstetrics and Gynecology | Admitting: Obstetrics and Gynecology

## 2024-06-05 DIAGNOSIS — N6310 Unspecified lump in the right breast, unspecified quadrant: Secondary | ICD-10-CM

## 2024-06-05 HISTORY — PX: BREAST BIOPSY: SHX20

## 2024-06-06 LAB — SURGICAL PATHOLOGY

## 2024-06-30 ENCOUNTER — Other Ambulatory Visit: Payer: Self-pay | Admitting: Cardiology

## 2024-07-19 DIAGNOSIS — E785 Hyperlipidemia, unspecified: Secondary | ICD-10-CM | POA: Insufficient documentation

## 2024-07-19 DIAGNOSIS — R072 Precordial pain: Secondary | ICD-10-CM | POA: Insufficient documentation

## 2024-07-19 NOTE — Progress Notes (Unsigned)
 Cardiology Office Note:   Date:  07/22/2024  ID:  Dana Diaz, DOB [birth year], MRN [id number] PCP: Verena Mems, MD  [facility] Providers Cardiologist:  Lynwood Schilling, MD {  History of Present Illness:   Dana Diaz is a 63 y.o. female who I saw previously for evaluation of lpitations for which she seeks advice at the request of Suzen Ivory, NP.  The patient has seen Dr. Monetta in the past.  She then saw Dr. Ladona.  Most recently she saw Dr. Claudene.  An echocardiogram in 2020 demonstrated normal left-ventricular function with no significant valvular abnormalities.  She wore a monitor for 2 weeks at that time.  There was some rare PVCs and a couple of brief runs of ventricular ectopy.  She has had a negative calcium score of 0 in 2020.  She also had a treadmill that was negative for any evidence of ischemia in 2024. After I saw her for the first time she wore a four week monitor and had short runs of atrial tach but no sustained arrhythmias.   She returns for follow-up.  She had 1 episode of tachypalpitations late last year.  She saw her primary and had an echocardiogram ordered.  It actually looked okay.  There were no significant abnormalities and I reviewed these results today for this appointment.  She is otherwise done okay.  She walks up and down her street for exercise.  She does have a stationary bicycle.  She planted a watermelon plant but the fruit just rotted.  She has learned from the experience  ROS: As stated in the HPI and negative for all other systems.  Studies Reviewed:    EKG:   EKG Interpretation Date/Time:  Tuesday July 22 2024 14:19:41 EDT Ventricular Rate:  80 PR Interval:  152 QRS Duration:  80 QT Interval:  366 QTC Calculation: 422 R Axis:   -27  Text Interpretation: Normal sinus rhythm Poor anterior R wave progression When compared with ECG of  2023 No significant change since last tracing Confirmed by Schilling Lynwood ([id number]) on 07/22/2024  2:22:01 PM     Risk Assessment/Calculations:              Physical Exam:   VS:  BP 138/86 (BP Location: Left Arm, Patient Position: Sitting, Cuff Size: Normal)   Pulse 80   Ht 5' 7 (1.702 m)   Wt 212 lb (96.2 kg)   LMP 07/28/2018 (Approximate)   SpO2 98%   BMI 33.20 kg/m    Wt Readings from Last 3 Encounters:  07/22/24 212 lb (96.2 kg)  06/18/23 216 lb (98 kg)  04/06/23 208 lb (94.3 kg)     GEN: Well nourished, well developed in no acute distress NECK: No JVD; No carotid bruits CARDIAC: RRR, no murmurs, rubs, gallops RESPIRATORY:  Clear to auscultation without rales, wheezing or rhonchi  ABDOMEN: Soft, non-tender, non-distended EXTREMITIES:  No edema; No deformity   ASSESSMENT AND PLAN:   Palpitations: These are not particular problematic and not different than when she wore the monitor previously.  No change in therapy.  No further testing.  She will continue the low-dose beta-blocker.   Dyslipidemia: LDL was 125 with the HDL 64.  At this point I do not suggest a statin.  Calcium score was previously 0 and she otherwise has low risk.  I will repeat a calcium score next August as it would have been 5 years and this may change goals of therapy.  Chest discomfort:   She had a negative POET (Plain Old Exercise Treadmill) in 2024.  She has had no new symptoms since then.  No change in therapy.     Follow up with me in about 18 months.   Signed, Lynwood Schilling, MD

## 2024-07-22 ENCOUNTER — Encounter: Payer: Self-pay | Admitting: Cardiology

## 2024-07-22 ENCOUNTER — Ambulatory Visit: Attending: Cardiology | Admitting: Cardiology

## 2024-07-22 VITALS — BP 138/86 | HR 80 | Ht 67.0 in | Wt 212.0 lb

## 2024-07-22 DIAGNOSIS — R072 Precordial pain: Secondary | ICD-10-CM | POA: Diagnosis not present

## 2024-07-22 DIAGNOSIS — E785 Hyperlipidemia, unspecified: Secondary | ICD-10-CM

## 2024-07-22 DIAGNOSIS — R002 Palpitations: Secondary | ICD-10-CM | POA: Diagnosis not present

## 2024-07-22 MED ORDER — ATENOLOL 25 MG PO TABS: 25.0000 mg | ORAL_TABLET | Freq: Every day | ORAL | 3 refills | Status: AC

## 2024-07-22 NOTE — Patient Instructions (Signed)
 Medication Instructions:  Your physician recommends that you continue on your current medications as directed. Please refer to the Current Medication list given to you today.  *If you need a refill on your cardiac medications before your next appointment, please call your pharmacy*  Lab Work: NONE If you have labs (blood work) drawn today and your tests are completely normal, you will receive your results only by: MyChart Message (if you have MyChart) OR A paper copy in the mail If you have any lab test that is abnormal or we need to change your treatment, we will call you to review the results.  Testing/Procedures: Coronary Calcium Score in Aug 2026 Your physician has requested that you have a coronary calcium score performed. This is not covered by insurance and will be an out-of-pocket cost of approximately $99.   Follow-Up: At [facility], you and your health needs are our priority.  As part of our continuing mission to provide you with exceptional heart care, our providers are all part of one team.  This team includes your primary Cardiologist (physician) and Advanced Practice Providers or APPs (Physician Assistants and Nurse Practitioners) who all work together to provide you with the care you need, when you need it.  Your next appointment:   18 months  Provider:   L[place], MD  We recommend signing up for the patient portal called MyChart.  Sign up information is provided on this After Visit Summary.  MyChart is used to connect with patients for Virtual Visits (Telemedicine).  Patients are able to view lab/test results, encounter notes, upcoming appointments, etc.  Non-urgent messages can be sent to your provider as well.   To learn more about what you can do with MyChart, go to [email or web address].

## 2025-06-15 ENCOUNTER — Other Ambulatory Visit (HOSPITAL_COMMUNITY)
